# Patient Record
Sex: Female | Born: 1945 | Race: White | Hispanic: No | Marital: Married | Smoking: Former smoker
Health system: Southern US, Community
[De-identification: ages and names within clinical notes are randomized; demographics above are authoritative.]

## PROBLEM LIST (undated history)

## (undated) DIAGNOSIS — B1910 Unspecified viral hepatitis B without hepatic coma: Secondary | ICD-10-CM

## (undated) DIAGNOSIS — R351 Nocturia: Secondary | ICD-10-CM

## (undated) DIAGNOSIS — I493 Ventricular premature depolarization: Secondary | ICD-10-CM

## (undated) DIAGNOSIS — M8580 Other specified disorders of bone density and structure, unspecified site: Secondary | ICD-10-CM

## (undated) DIAGNOSIS — Z9079 Acquired absence of other genital organ(s): Secondary | ICD-10-CM

## (undated) DIAGNOSIS — Z90722 Acquired absence of ovaries, bilateral: Secondary | ICD-10-CM

## (undated) HISTORY — DX: Unspecified viral hepatitis B without hepatic coma: B19.10

## (undated) HISTORY — DX: Nocturia: R35.1

## (undated) HISTORY — DX: Ventricular premature depolarization: I49.3

## (undated) HISTORY — DX: Acquired absence of ovaries, bilateral: Z90.722

## (undated) HISTORY — PX: HERNIA REPAIR: SHX51

## (undated) HISTORY — DX: Other specified disorders of bone density and structure, unspecified site: M85.80

## (undated) HISTORY — DX: Acquired absence of other genital organ(s): Z90.79

---

## 1998-05-19 HISTORY — PX: CHOLECYSTECTOMY: SHX55

## 1999-03-06 ENCOUNTER — Other Ambulatory Visit: Admission: RE | Admit: 1999-03-06 | Discharge: 1999-03-06 | Payer: Self-pay | Admitting: Obstetrics and Gynecology

## 2000-06-04 ENCOUNTER — Other Ambulatory Visit: Admission: RE | Admit: 2000-06-04 | Discharge: 2000-06-04 | Payer: Self-pay | Admitting: Obstetrics and Gynecology

## 2000-06-26 ENCOUNTER — Encounter (INDEPENDENT_AMBULATORY_CARE_PROVIDER_SITE_OTHER): Payer: Self-pay | Admitting: Specialist

## 2000-06-26 ENCOUNTER — Other Ambulatory Visit: Admission: RE | Admit: 2000-06-26 | Discharge: 2000-06-26 | Payer: Self-pay | Admitting: Obstetrics and Gynecology

## 2000-12-10 ENCOUNTER — Encounter: Admission: RE | Admit: 2000-12-10 | Discharge: 2000-12-10 | Payer: Self-pay | Admitting: Family Medicine

## 2000-12-10 ENCOUNTER — Encounter: Payer: Self-pay | Admitting: Family Medicine

## 2001-01-08 ENCOUNTER — Encounter: Payer: Self-pay | Admitting: Gastroenterology

## 2001-01-08 ENCOUNTER — Encounter: Admission: RE | Admit: 2001-01-08 | Discharge: 2001-01-08 | Payer: Self-pay | Admitting: Gastroenterology

## 2001-09-07 ENCOUNTER — Other Ambulatory Visit: Admission: RE | Admit: 2001-09-07 | Discharge: 2001-09-07 | Payer: Self-pay | Admitting: Obstetrics and Gynecology

## 2001-10-25 ENCOUNTER — Encounter (INDEPENDENT_AMBULATORY_CARE_PROVIDER_SITE_OTHER): Payer: Self-pay | Admitting: *Deleted

## 2001-10-25 ENCOUNTER — Ambulatory Visit (HOSPITAL_COMMUNITY): Admission: RE | Admit: 2001-10-25 | Discharge: 2001-10-26 | Payer: Self-pay | Admitting: *Deleted

## 2002-05-04 ENCOUNTER — Encounter: Admission: RE | Admit: 2002-05-04 | Discharge: 2002-08-02 | Payer: Self-pay | Admitting: Occupational Medicine

## 2002-05-19 HISTORY — PX: OOPHORECTOMY: SHX86

## 2002-10-05 ENCOUNTER — Other Ambulatory Visit: Admission: RE | Admit: 2002-10-05 | Discharge: 2002-10-05 | Payer: Self-pay | Admitting: Obstetrics and Gynecology

## 2002-10-21 ENCOUNTER — Ambulatory Visit (HOSPITAL_COMMUNITY): Admission: RE | Admit: 2002-10-21 | Discharge: 2002-10-21 | Payer: Self-pay | Admitting: Gastroenterology

## 2003-01-11 ENCOUNTER — Encounter: Admission: RE | Admit: 2003-01-11 | Discharge: 2003-01-11 | Payer: Self-pay

## 2003-02-01 ENCOUNTER — Ambulatory Visit: Admission: RE | Admit: 2003-02-01 | Discharge: 2003-02-01 | Payer: Self-pay | Admitting: Gynecology

## 2003-02-14 ENCOUNTER — Encounter: Admission: RE | Admit: 2003-02-14 | Discharge: 2003-02-14 | Payer: Self-pay | Admitting: Obstetrics and Gynecology

## 2003-02-14 ENCOUNTER — Encounter: Payer: Self-pay | Admitting: Obstetrics and Gynecology

## 2003-03-14 ENCOUNTER — Encounter (INDEPENDENT_AMBULATORY_CARE_PROVIDER_SITE_OTHER): Payer: Self-pay

## 2003-03-14 ENCOUNTER — Ambulatory Visit (HOSPITAL_COMMUNITY): Admission: RE | Admit: 2003-03-14 | Discharge: 2003-03-14 | Payer: Self-pay | Admitting: Obstetrics and Gynecology

## 2004-01-03 ENCOUNTER — Other Ambulatory Visit: Admission: RE | Admit: 2004-01-03 | Discharge: 2004-01-03 | Payer: Self-pay | Admitting: Obstetrics and Gynecology

## 2004-06-24 ENCOUNTER — Emergency Department (HOSPITAL_COMMUNITY): Admission: EM | Admit: 2004-06-24 | Discharge: 2004-06-24 | Payer: Self-pay | Admitting: Emergency Medicine

## 2005-05-26 ENCOUNTER — Other Ambulatory Visit: Admission: RE | Admit: 2005-05-26 | Discharge: 2005-05-26 | Payer: Self-pay | Admitting: Obstetrics and Gynecology

## 2009-07-17 ENCOUNTER — Emergency Department (HOSPITAL_BASED_OUTPATIENT_CLINIC_OR_DEPARTMENT_OTHER): Admission: EM | Admit: 2009-07-17 | Discharge: 2009-07-17 | Payer: Self-pay | Admitting: Emergency Medicine

## 2009-07-17 ENCOUNTER — Ambulatory Visit: Payer: Self-pay | Admitting: Interventional Radiology

## 2010-10-04 NOTE — H&P (Signed)
NAME:  Meghan Rodgers, Meghan Rodgers                          ACCOUNT NO.:  [id number]   MEDICAL RECORD NO.:  [id number]                   PATIENT TYPE:  AMB   LOCATION:  SDC                                  FACILITY:  [facility]   PHYSICIAN:  Juluis Mire, M.D.                DATE OF BIRTH:  [birth year]   DATE OF ADMISSION:  03/14/2003  DATE OF DISCHARGE:                                HISTORY & PHYSICAL   HISTORY OF PRESENT ILLNESS:  The patient is a 65 year old gravida 2, para 2  postmenopausal white female who presents for laparoscopic bilateral salpingo-  oophorectomy.   In relation to the present admission she had been evaluated by Areatha Keas,  M.D. complaining of swelling.  Because of concern about a possible  neoplastic process she had a CT scan performed on August 20.  This revealed  a 2 cm right ovarian cyst.  Subsequent ultrasound revealed a 2.2 cm complex  cyst of the right ovary.  Her CA-125 was 4.2.  She had a follow-up  ultrasound with persistent cystic enlargement of the ovary.  She underwent  consultation with De Blanch, M.D. of the [facility]  [facility].  It was felt that if this persisted surgical removal would  certainly be an option.  She now presents for laparoscopic bilateral  salpingo-oophorectomy per her request, although the risk of ovarian cancer  appears to be relatively low.   ALLERGIES:  MYCINS.   MEDICATIONS:  None.   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   PAST SURGICAL HISTORY:  She has had bilateral hernia repairs as a child and  did have a laparoscopic cholecystectomy.   PAST OBSTETRICAL HISTORY:  Two spontaneous vaginal deliveries.   FAMILY HISTORY:  Basically noncontributory.   SOCIAL HISTORY:  No tobacco or alcohol use.   REVIEW OF SYSTEMS:  Noncontributory.   PHYSICAL EXAMINATION:  VITAL SIGNS:  The patient is afebrile with stable  vital signs.  HEENT:  The patient is normocephalic.   Pupils are equal, round, and reactive  to light and accommodation.  Extraocular movements are intact.  Sclerae and  conjunctivae clear.  Oropharynx clear.  NECK:  Without thyromegaly.  BREASTS:  No discreet masses.  LUNGS:  Clear.  CARDIOVASCULAR:  Regular rhythm and rate without murmurs or gallops.  ABDOMEN:  Benign.  No masses, organomegaly, or tenderness.  PELVIC:  Normal external genitalia.  Vaginal mucosa is clear.  Cervix  unremarkable.  Uterus normal size, shape, and contour.  Adnexa:  No masses  are appreciated.  Rectovaginal examination is clear.  EXTREMITIES:  Trace edema.  NEUROLOGIC:  Grossly within normal limits.   IMPRESSION:  Persistent cystic enlargement of the right ovary.   PLAN:  The patient will undergo laparoscopic bilateral salpingo-  oophorectomy.  Preoperative stents will be placed.  Risks of surgery have  been explained including the risk of  infection, the risk of hemorrhage, the  risk of injury to adjacent organs that could require further exploratory  surgery, risk of deep venous thrombosis and pulmonary embolus.  The patient  expressed understanding of indications and risks.                                               Juluis Mire, M.D.    JSM/MEDQ  D:  03/14/2003  T:  03/14/2003  Job:  [id number]

## 2010-10-04 NOTE — Op Note (Signed)
   NAME:  Meghan Rodgers, Meghan Rodgers                          ACCOUNT NO.:  [id number]   MEDICAL RECORD NO.:  [id number]                   PATIENT TYPE:  AMB   LOCATION:  SDC                                  [facility]   PHYSICIAN:  Meghan Rodgers, M.D.               DATE OF BIRTH:  March 26, 1946   DATE OF PROCEDURE:  03/14/2003  DATE OF DISCHARGE:                                 OPERATIVE REPORT   PROCEDURE:  Cystoscopy and insertion of bilateral double J ureteral  catheters.   SURGEON:  Meghan Rodgers, M.D.   ANESTHESIA:  General.   INDICATIONS:  The patient is a 65 year old female who is scheduled for total  abdominal hysterectomy and at Dr. Lisbeth Ply request, we inserted bilateral  ureteral catheters.   Under general anesthesia, the patient was prepped and draped and placed in  the dorsal lithotomy position.  A #23 Wappler cystoscope was inserted in the  bladder.  The bladder mucosa is normal.  There is no stone or tumor in the  bladder.  The ureteral orifices are in normal position and shape with clear  efflux.  Then a #6 [place] open-end catheter was passed over a Glidewire and  the ureteral catheter was passed through the cystoscope into the left  ureteral orifice and the open-end catheter was advanced over the guidewire  into the renal pelvis.  The same procedure was done on the right side.   Then a #16 Foley catheter was inserted in the bladder.  The patient was left  in dorsal lithotomy position for the procedure by Dr. Arelia Sneddon.                                               Meghan Rodgers, M.D.    MN/MEDQ  D:  03/14/2003  T:  03/14/2003  Job:  [id number]   cc:   Juluis Mire, M.D.  [address] [place]  [place] [address]  Fax: [phone]

## 2010-10-04 NOTE — Consult Note (Signed)
NAME:  Meghan Rodgers, Meghan Rodgers NO.:  [id number]   MEDICAL RECORD NO.:  [id number]                   PATIENT TYPE:  OUT   LOCATION:  GYN                                  FACILITY:  [facility]   PHYSICIAN:  De Blanch, M.D.         DATE OF BIRTH:  1945/12/14   DATE OF CONSULTATION:  02/01/2003  DATE OF DISCHARGE:                                   CONSULTATION   REASON FOR CONSULTATION:  A 65 year old woman married female seen at the  request of Dr. Arelia Sneddon regarding management of a newly-diagnosed ovarian  cyst.   HISTORY OF PRESENT ILLNESS:  The patient apparently presented complaining of  swelling in her hands and supraclavicular region.  Because of the  possibility of these symptoms representing a neoplastic syndrome, the  patient underwent a CT scan which showed a 2.2 cm ovarian cyst.  Subsequently, she has had an ultrasound on January 11, 2003 which shows a 2.2  cm cyst with internal echoes and an irregular posterior wall.  CA-125 value  was 4.2.   The patient's gynecologic history is relatively unremarkable, except for the  fact that she went through menopause relatively early at age 63.  She took  hormone replacement therapy for approximately six years, but has not taken  any for the past six years.  She has no family history of ovarian, breast,  or colon cancer.   OBSTETRICAL HISTORY:  Gravida 2.   PAST MEDICAL HISTORY:  Medical illnesses - none.   PAST SURGICAL HISTORY:  A cholecystectomy, left inguinal hernia repair as a  child, right inguinal hernia repair as a young adult.   DRUG ALLERGIES:  1. NAPROXEN.  2. ILOSONE.   REVIEW OF SYSTEMS:  Essentially negative, except as noted above.  The  patient's main concern is that her face is widened.  She has fullness in the  supraclavicular fossa and axillary region, as well as swelling in her  fingers.  She has no cardiovascular, pulmonary, GI, or GU symptoms.  She did  have a colonoscopy by  Dr. Matthias Hughs recently.   PHYSICAL EXAMINATION:  VITAL SIGNS:  Weight 188 pounds, height 5'6, blood  pressure 124/78, pulse 84, respiratory rate 24.  GENERAL:  The patient is a healthy white female in no acute distress.  HEENT:  Negative.  There is some fullness in the supraclavicular fossa  without any masses, nodularity, or adenopathy.  NECK:  Supple without thyromegaly.  There is no supraclavicular or inguinal  adenopathy.  ABDOMEN:  Obese, soft, nontender.  No mass, organomegaly, or hernias noted.  Her laparoscopic cholecystectomy scars are well healed.  PELVIC:  EGBUS, vagina, bladder, and urethra are normal.  Cervix is normal.  Uterus is small and anterior.  I do not palpate any adnexal masses.  RECTOVAGINAL:  Confirms.  EXTREMITIES:  Lower extremities are without edema or varicosities.   IMPRESSION:  A 2.2 cm ovarian cyst with internal echoes  and irregular wall.  I would recommend the patient have a repeat ultrasound in 4-6 weeks  following the previous one.  If the cyst is resolved, then I would dispense  with any further follow up.  On the other hand, it if persists or has gotten  larger, I would suggest it be removed laparoscopically, and obtain  intraoperative projections.  If the patient were to require surgery, I would  be happy to be on stand by with these circumstances, which I think are  actually quite low risk for ovarian cancer.                                               De Blanch, M.D.    DC/MEDQ  D:  02/01/2003  T:  02/01/2003  Job:  [id number]   cc:   Juluis Mire, M.D.  [address] [place]  [place]  [place] [address]  Fax: [phone]   Areatha Keas, M.D.  [address]  [address]  [place]  [place] [address]  Fax: [phone]

## 2010-10-04 NOTE — Op Note (Signed)
   NAME:  Meghan Rodgers, Meghan Rodgers                          ACCOUNT NO.:  [id number]   MEDICAL RECORD NO.:  [id number]                   PATIENT TYPE:  AMB   LOCATION:  ENDO                                 FACILITY:  [facility]   PHYSICIAN:  Bernette Redbird, M.D.                DATE OF BIRTH:  07-Jun-1945   DATE OF PROCEDURE:  10/21/2002  DATE OF DISCHARGE:                                 OPERATIVE REPORT   PROCEDURE PERFORMED:  Colonoscopy.   ENDOSCOPIST:  Bernette Redbird, M.D.   INDICATIONS:  [age]-year-old female for screening.  No worrisome risk  factors or symptoms.  There is a tendency towards diarrhea after fatty meals  since her cholecystectomy about a year ago.   FINDINGS:  Normal exam to the terminal ileum.   PROCEDURE:  The nature, purpose and risks of the procedure had previously  been discussed with the patient who provided written consent.   SEDATION:  Fentanyl 70 mcg and Versed 7 mg IV without arrhythmias or  desaturation.   DESCRIPTION OF PROCEDURE:  The Olympus adjustable tension pediatric video  colonoscope was advanced into the terminal ileum without difficulty,  negotiating some tight angulations in the sigmoid region.  Pullback was then  performed.  Photo documentation of the cecum was obtained.   The quality of the prep was excellent and it is felt that all areas were  well-seen.   This was a normal examination.  No polyps, cancer, colitis, vascular  malformations, or diverticular disease were observed.  Retroflexion in the  rectum as well as reinspection of the rectosigmoid was unremarkable.  No  biopsies were obtained.   The patient tolerated the procedure well and there were no apparent  complications.    IMPRESSION:  Normal screening colonoscopy.   PLAN:  Consider screening flex-sig in five years for ongoing screening.                                                 Bernette Redbird, M.D.    RB/MEDQ  D:  10/21/2002  T:  10/23/2002  Job:  [id number]   cc:    Juluis Mire, M.D.  [address] [place]  [place] [address]  Fax: [phone]   Teena Irani. Arlyce Dice, M.D.  [address]  [place]  [place] [address]  Fax: [phone]

## 2010-10-04 NOTE — Op Note (Signed)
NAME:  Meghan Rodgers, Meghan Rodgers                          ACCOUNT NO.:  [id number]   MEDICAL RECORD NO.:  [id number]                   PATIENT TYPE:  AMB   LOCATION:  SDC                                  [facility]   PHYSICIAN:  Juluis Mire, M.D.                DATE OF BIRTH:  09-02-1945   DATE OF PROCEDURE:  03/14/2003  DATE OF DISCHARGE:                                 OPERATIVE REPORT   PREOPERATIVE DIAGNOSIS:  Cystic enlargement of the right ovary.   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   PROCEDURE:  Open laparoscopy with bilateral salpingo-oophorectomy, lysis of  adhesions, repair of umbilical hernia.   SURGEON:  Juluis Mire, M.D.   ANESTHESIA:  General endotracheal anesthesia.   ESTIMATED BLOOD LOSS:  Minimal.   PACKS AND DRAINS:  None.   INTRAOPERATIVE BLOOD PLACED:  None.   COMPLICATIONS:  None.   INDICATIONS FOR PROCEDURE:  These are dictated in the history and physical.   DESCRIPTION OF PROCEDURE:  The patient taken to the OR and placed in the  supine position.  After satisfactory level of general endotracheal  anesthesia was obtained, the patient was placed in the dorsal lithotomy  position.  Dr. Brunilda Payor came in at this time and placed in bilateral ureteral  stents.  Foley was emptied at that point.  We have completely prepped the  abdomen, perineum and vagina.  Hulka tenaculum was then put in place and  secured.  The patient was draped out for laparoscopy.  Subumbilical incision  was made with the knife and carried through the subcutaneous tissue.  Noted  at this time that omentum was extruding through a fascial defect from a  previous cholecystectomy.  We, therefore, placed the open laparoscopic  trocar and secured it.  The laparoscope was introduced.  There was omentum  noted around the umbilicus but the inferior aspect was clear so we could  easily see the pelvis.  The 5 mm trocars were placed in suprapubic area  under direct visualization.  A  third trocar was placed in the left lower  quadrant after visualization of epigastric vessels.  Uterus was elevated.  The right ovary was cystically enlarged.  There were no excrescences.  We  did obtain pelvic washings that were sent for cytology.  She had some  adhesions between the sigmoid colon and the left pelvic sidewall.  These  were flimsy and taken down easily.  We first elevated the right ovary,  identified the right ureter but with aid of the stent using the Gyrus  tripolar, we cauterized and incised the right ovarian vasculature.  We  cauterized and incised the peritoneal attachment of the ovary and we  cauterized and incised the utero-ovarian pedicle and right tube and  mesosalpinx.  The ovary was then placed in the Endo bag and removed through  the subumbilical port.  It was sent for frozen section  which came back with  a benign adenofibroma.  We had good hemostasis.  Next, we went to the left  side.  The left ovary was elevated.  The left ureter was identified easily  with the stent.  The left ovarian vasculature was cauterized, incised using  the Gyrus bipolar.  The attachment of the mesosalpinx was then cauterized  and incised, the left utero-ovarian pedicle, tube and mesosalpinx were  cauterized and incised.  The ovary was removed through the subumbilical  port.  We had good hemostasis bilaterally.  All adhesions were taken down.  At this point in time, we thoroughly irrigated the pelvis. Irrigation  removed.  The appendix was visualized and noted to be normal.  We removed  all trocars.  We removed the omentum from inside the umbilicus.  We  identified the fascial edge and brought those together with a running suture  of 0 PDS.  This completely reapproximated the fascial edges.  The skin was  closed with running subcuticular 4-0 Vicryl. The suprapubic incision was  closed with Dermabond.  The Hulka tenaculum was then removed.  The patient  was taken out of the dorsal  lithotomy position, once alert and extubated,  transferred to the recovery room in good condition.  It is of note, the  sponge, needle and instrument counts were correct by circulating nurse x2.  The ureteral stents were removed and packed.                                               Juluis Mire, M.D.    JSM/MEDQ  D:  03/14/2003  T:  03/14/2003  Job:  [id number]

## 2010-10-04 NOTE — Op Note (Signed)
[facility]  Patient:    Meghan Rodgers, Meghan Rodgers Visit Number: [id number] MRN: [id number]          Service Type: DSU Location: [id number] Attending Physician:  Vikki Ports. Dictated by:   Earna Coder, M.D. Proc. Date: 10/24/01 Admit Date:  10/25/2001 Discharge Date: 10/26/2001                             Operative Report  PREOPERATIVE DIAGNOSIS:  Cholelithiasis.  POSTOPERATIVE DIAGNOSIS:  Cholelithiasis.  OPERATION PERFORMED:  Laparoscopic cholecystectomy.  SURGEON:  Stephenie Acres, M.D.  ASSISTANT:  Currie Paris, M.D.  ANESTHESIA:  General.  DESCRIPTION OF PROCEDURE:  The patient was taken to the operating room and placed in supine position.  After adequate anesthesia was induced using endotracheal tube, the abdomen was prepped and draped in normal sterile fashion.  Using a transverse infraumbilical incision, I dissected down to the fascia.  The fascia was opened vertically.  A 0 Vicryl pursestring suture was placed around the fascial defect.  Hasson trocar was placed in the abdomen and the abdomen was insufflated with carbon dioxide.  Under direct visualization, a 10 mm port was placed in the subxiphoid region and two 5 mm ports were placed in the right abdomen.  The gallbladder was identified and retracted cephalad.  The infundibulum was identified.  The cystic duct was easily dissected free.  A good window was created behind it.  Its junction with the gallbladder was identified.  The cystic duct was then triply and divided.  The cystic artery had both anterior and posterior branches.  These were dissected free of surrounding structures, triply clipped and divided.  The gallbladder was taken off the gallbladder bed using bipolar electrocautery and removed through the umbilical port.  Adequate hemostasis was ensured.  The abdomen was allowed to deflate.  All trocars were removed.  Incisions were injected  using 0.5% Marcaine.  Fascial defect was closed with the 0 Vicryl pursestring suture.  Skin incisions were closed subcuticular 4-0 Monocryl.  Steri-Strips and sterile dressings were applied.  The patient tolerated the procedure well and went to PACU in good condition. Dictated by:   Earna Coder, M.D. Attending Physician:  Danna Hefty R. DD:  10/25/01 TD:  10/27/01 Job: [id number]

## 2011-09-23 ENCOUNTER — Encounter: Payer: Self-pay | Admitting: *Deleted

## 2011-09-24 ENCOUNTER — Ambulatory Visit (INDEPENDENT_AMBULATORY_CARE_PROVIDER_SITE_OTHER): Payer: 59 | Admitting: Cardiovascular Disease

## 2011-09-24 ENCOUNTER — Encounter: Payer: Self-pay | Admitting: Cardiovascular Disease

## 2011-09-24 VITALS — BP 152/87 | HR 61 | Ht 66.5 in | Wt 196.0 lb

## 2011-09-24 DIAGNOSIS — I4949 Other premature depolarization: Secondary | ICD-10-CM

## 2011-09-24 DIAGNOSIS — I493 Ventricular premature depolarization: Secondary | ICD-10-CM | POA: Insufficient documentation

## 2011-09-24 DIAGNOSIS — I10 Essential (primary) hypertension: Secondary | ICD-10-CM | POA: Insufficient documentation

## 2011-09-24 LAB — BASIC METABOLIC PANEL
Calcium: 9.3 mg/dL (ref 8.4–10.5)
Creatinine, Ser: 0.9 mg/dL (ref 0.4–1.2)
GFR: 70.69 mL/min (ref >60.00)
Sodium: 141 mEq/L (ref 135–145)

## 2011-09-24 NOTE — Assessment & Plan Note (Addendum)
Meghan Rodgers presents for further evaluation of her palpitations. She has heartbeat irregular to set are consistent with premature ventricular contractions. We saw one PVC on her EKG.  We discussed the various causes of PVCs including hyperlipidemia, thyroid abnormalities , excessive caffeine,.  We will check a TSH and a basic metabolic profile. I've asked her to walk on a regular basis. I think that her right her exercise this will help slow her heart rate down.  He does have some dyspnea on exertion. I would like to get an echocardiogram for further evaluation to make sure that she does not have any structural heart disease. I'll see her again in 3 months.

## 2011-09-24 NOTE — Patient Instructions (Signed)
Your physician recommends that you return for lab work in: TODAY, TSH, BMET  Your physician has requested that you have an echocardiogram. Echocardiography is a painless test that uses sound waves to create images of your heart. It provides your doctor with information about the size and shape of your heart and how well your heart's chambers and valves are working. This procedure takes approximately one hour. There are no restrictions for this procedure.  Your physician recommends that you schedule a follow-up appointment in: 3 MONTHS     DASH Diet The DASH diet stands for "Dietary Approaches to Stop Hypertension." It is a healthy eating plan that has been shown to reduce high blood pressure (hypertension) in as little as 14 days, while also possibly providing other significant health benefits. These other health benefits include reducing the risk of breast cancer after menopause and reducing the risk of type 2 diabetes, heart disease, colon cancer, and stroke. Health benefits also include weight loss and slowing kidney failure in patients with chronic kidney disease.  DIET GUIDELINES  Limit salt (sodium). Your diet should contain less than 1500 mg of sodium daily.   Limit refined or processed carbohydrates. Your diet should include mostly whole grains. Desserts and added sugars should be used sparingly.   Include small amounts of heart-healthy fats. These types of fats include nuts, oils, and tub margarine. Limit saturated and trans fats. These fats have been shown to be harmful in the body.  CHOOSING FOODS  The following food groups are based on a 2000 calorie diet. See your Registered Dietitian for individual calorie needs. Grains and Grain Products (6 to 8 servings daily)  Eat More Often: Whole-wheat bread, brown rice, whole-grain or wheat pasta, quinoa, popcorn without added fat or salt (air popped).   Eat Less Often: White bread, white pasta, white rice, cornbread.  Vegetables (4 to 5  servings daily)  Eat More Often: Fresh, frozen, and canned vegetables. Vegetables may be raw, steamed, roasted, or grilled with a minimal amount of fat.   Eat Less Often/Avoid: Creamed or fried vegetables. Vegetables in a cheese sauce.  Fruit (4 to 5 servings daily)  Eat More Often: All fresh, canned (in natural juice), or frozen fruits. Dried fruits without added sugar. One hundred percent fruit juice ( cup [237 mL] daily).   Eat Less Often: Dried fruits with added sugar. Canned fruit in light or heavy syrup.  [employer], Fish, and Poultry (2 servings or less daily. One serving is 3 to 4 oz [85-114 g]).  Eat More Often: Ninety percent or leaner ground beef, tenderloin, sirloin. Round cuts of beef, chicken breast, [place] breast. All fish. Grill, bake, or broil your meat. Nothing should be fried.   Eat Less Often/Avoid: Fatty cuts of meat, [place], or chicken leg, thigh, or wing. Fried cuts of meat or fish.  Dairy (2 to 3 servings)  Eat More Often: Low-fat or fat-free milk, low-fat plain or light yogurt, reduced-fat or part-skim cheese.   Eat Less Often/Avoid: Milk (whole, 2%, skim, or chocolate).Whole milk yogurt. Full-fat cheeses.  Nuts, Seeds, and Legumes (4 to 5 servings per week)  Eat More Often: All without added salt.   Eat Less Often/Avoid: Salted nuts and seeds, canned beans with added salt.  Fats and Sweets (limited)  Eat More Often: Vegetable oils, tub margarines without trans fats, sugar-free gelatin. Mayonnaise and salad dressings.   Eat Less Often/Avoid: Coconut oils, palm oils, butter, stick margarine, cream, half and half, cookies, candy, pie.  FOR MORE INFORMATION The Dash Diet Eating Plan: www.dashdiet.org Document Released: 04/24/2011 Document Reviewed: 04/14/2011 [facility] Patient Information 2012 [place], [place].

## 2011-09-24 NOTE — Progress Notes (Signed)
    Meghan Rodgers Date of Birth  12/27/1945       [facility] Office [address], [address]  [address] [place], [place]  [address]   [place], [place]  [address] [phone]     [phone]   Fax  [phone]    Fax [phone]  Problem List: 1. Palpitations / PVCs 2. Hypertension   History of Present Illness:  Meghan Rodgers is a 66 y.o. female with hx of palpitations and hypertension.  She has these occasional palpitations which according her are about every 8 beats. They do not cause her to have any syncope, shortness breath, or chest pain. She has some tinnitus and therefore is able to hear these irregular heartbeats.  She does not get any regular aerobic exercise. She is to go to yoga classes but has not done so in 2 years.  She eats a fairly unrestricted diet.  No current outpatient prescriptions on file prior to visit.    Allergies  Allergen Reactions  . Ilosone (Erythromycin)   . Naproxen     Past Medical History  Diagnosis Date  . PVC (premature ventricular contraction)     The Patient has atrial tachycardia in the past  . Nocturia   . H/O bilateral salpingo-oophorectomy     Previous laparoscopic, for adenofibromas  . Osteopenia     stable or improving  . Hepatitis B     Positive for core antigen    Past Surgical History  Procedure Date  . Cholecystectomy 2000  . Hernia repair 1950, 1979  . Oophorectomy 2004    History  Smoking status  . Former Smoker  Smokeless tobacco  . Not on file  Comment: Quit 6yrs ago    History  Alcohol Use  . Yes    Occas.    History reviewed. No pertinent family history.  Reviw of Systems:  Reviewed in the HPI.  All other systems are negative.  Physical Exam: Blood pressure 152/87, pulse 61, height 5' 6.5" (1.689 m), weight 196 lb (88.905 kg). General: Well developed, well nourished, in no acute distress.  Head: Normocephalic, atraumatic, sclera non-icteric, mucus  membranes are moist,   Neck: Supple. Carotids are 2 + without bruits. No JVD  Lungs: Clear bilaterally to auscultation.  Heart: regular rate.  normal  S1 S2. No murmurs, gallops or rubs.  Abdomen: Soft, non-tender, non-distended with normal bowel sounds. No hepatomegaly. No rebound/guarding. No masses.  Msk:  Strength and tone are normal  Extremities: No clubbing or cyanosis. No edema.  Distal pedal pulses are 2+ and equal bilaterally.  Neuro: Alert and oriented X 3. Moves all extremities spontaneously.  Psych:  Responds to questions appropriately with a normal affect.  ECG: Sep 24, 2011-normal sinus rhythm at 61 beats a minute. She has sinus arrhythmia. She has occasional premature ventricular contractions.  Assessment / Plan:

## 2011-09-24 NOTE — Assessment & Plan Note (Signed)
She eats a fairly unrestricted diet. I've asked her to watch her salt intake. We've given her some instructions on the DASH diet.

## 2011-09-26 ENCOUNTER — Ambulatory Visit (HOSPITAL_COMMUNITY): Payer: 59

## 2011-09-30 ENCOUNTER — Other Ambulatory Visit (HOSPITAL_COMMUNITY): Payer: 59

## 2011-09-30 ENCOUNTER — Other Ambulatory Visit: Payer: Self-pay

## 2011-09-30 ENCOUNTER — Other Ambulatory Visit (HOSPITAL_COMMUNITY): Payer: Self-pay | Admitting: Cardiovascular Disease

## 2011-09-30 ENCOUNTER — Ambulatory Visit (HOSPITAL_COMMUNITY): Payer: 59 | Attending: Cardiology

## 2011-09-30 DIAGNOSIS — I10 Essential (primary) hypertension: Secondary | ICD-10-CM | POA: Insufficient documentation

## 2011-09-30 DIAGNOSIS — R002 Palpitations: Secondary | ICD-10-CM

## 2011-09-30 DIAGNOSIS — B1910 Unspecified viral hepatitis B without hepatic coma: Secondary | ICD-10-CM | POA: Insufficient documentation

## 2011-10-06 ENCOUNTER — Ambulatory Visit: Payer: 59 | Attending: Family Medicine | Admitting: Audiology

## 2011-10-06 DIAGNOSIS — H905 Unspecified sensorineural hearing loss: Secondary | ICD-10-CM | POA: Insufficient documentation

## 2011-11-05 ENCOUNTER — Encounter: Payer: Self-pay | Admitting: Cardiovascular Disease

## 2012-01-02 ENCOUNTER — Encounter: Payer: Self-pay | Admitting: Cardiovascular Disease

## 2012-01-02 ENCOUNTER — Ambulatory Visit (INDEPENDENT_AMBULATORY_CARE_PROVIDER_SITE_OTHER): Payer: 59 | Admitting: Cardiovascular Disease

## 2012-01-02 VITALS — BP 142/78 | HR 71 | Ht 66.5 in | Wt 197.8 lb

## 2012-01-02 DIAGNOSIS — R768 Other specified abnormal immunological findings in serum: Secondary | ICD-10-CM | POA: Insufficient documentation

## 2012-01-02 DIAGNOSIS — R894 Abnormal immunological findings in specimens from other organs, systems and tissues: Secondary | ICD-10-CM

## 2012-01-02 DIAGNOSIS — I4949 Other premature depolarization: Secondary | ICD-10-CM

## 2012-01-02 DIAGNOSIS — I493 Ventricular premature depolarization: Secondary | ICD-10-CM

## 2012-01-02 NOTE — Assessment & Plan Note (Signed)
Pt is doing very well. She still has occasional palpitations. She has normal left ventricular systolic function. I'll see her back on an as-needed basis.

## 2012-01-02 NOTE — Patient Instructions (Addendum)
Your physician recommends that you schedule a follow-up appointment in: AS NEEDED BASIS  

## 2012-01-02 NOTE — Progress Notes (Signed)
    Meghan Rodgers Date of Birth  06-Aug-1945       [facility] Office [address]  [address] [place], [place]  [address]   [place], [place]  [address] [phone]     [phone]   Fax  [phone]    Fax [phone]  Problem List: 1. Palpitations / PVCs 2. Hypertension   History of Present Illness:  Meghan Rodgers is a 66 y.o. female with hx of palpitations and hypertension.  She has these occasional palpitations which according her are about every 8 beats. They do not cause her to have any syncope, shortness breath, or chest pain. She has some tinnitus and therefore is able to hear these irregular heartbeats.  She does not get any regular aerobic exercise. She is to go to yoga classes but has not done so in 2 years.  She eats a fairly unrestricted diet.  Her echo was normal.    As an aside, she was rejected by the [facility] for Hepatitis B antibodies.  No active disease   Current Outpatient Prescriptions on File Prior to Visit  Medication Sig Dispense Refill  . NON FORMULARY Max GXL Daily      . NON FORMULARY Protandum Daily        Allergies  Allergen Reactions  . Ilosone (Erythromycin)   . Naproxen     Past Medical History  Diagnosis Date  . PVC (premature ventricular contraction)     The Patient has atrial tachycardia in the past  . Nocturia   . H/O bilateral salpingo-oophorectomy     Previous laparoscopic, for adenofibromas  . Osteopenia     stable or improving  . Hepatitis B     Positive for core antigen    Past Surgical History  Procedure Date  . Cholecystectomy 2000  . Hernia repair 1950, 1979  . Oophorectomy 2004    History  Smoking status  . Former Smoker  Smokeless tobacco  . Not on file  Comment: Quit 36yrs ago    History  Alcohol Use  . Yes    Occas.    No family history on file.  Reviw of Systems:  Reviewed in the HPI.  All other systems are negative.  Physical Exam: Blood  pressure 142/78, pulse 71, height 5' 6.5" (1.689 m), weight 197 lb 12.8 oz (89.721 kg). General: Well developed, well nourished, in no acute distress.  Head: Normocephalic, atraumatic, sclera non-icteric, mucus membranes are moist,   Neck: Supple. Carotids are 2 + without bruits. No JVD  Lungs: Clear bilaterally to auscultation.  Heart: regular rate.  normal  S1 S2. No murmurs, gallops or rubs.  Abdomen: Soft, non-tender, non-distended with normal bowel sounds. No hepatomegaly. No rebound/guarding. No masses.  Msk:  Strength and tone are normal  Extremities: No clubbing or cyanosis. No edema.  Distal pedal pulses are 2+ and equal bilaterally.  Neuro: Alert and oriented X 3. Moves all extremities spontaneously.  Psych:  Responds to questions appropriately with a normal affect.  ECG: Sep 24, 2011-normal sinus rhythm at 61 beats a minute. She has sinus arrhythmia. She has occasional premature ventricular contractions.  Assessment / Plan:

## 2012-07-03 ENCOUNTER — Other Ambulatory Visit: Payer: Self-pay

## 2012-08-03 ENCOUNTER — Emergency Department (HOSPITAL_COMMUNITY)
Admission: EM | Admit: 2012-08-03 | Discharge: 2012-08-03 | Disposition: A | Discharge status: Home / Self Care | Payer: 59 | Source: Home / Self Care | Attending: Emergency Medicine | Admitting: Emergency Medicine

## 2012-08-03 ENCOUNTER — Encounter (HOSPITAL_COMMUNITY): Payer: Self-pay | Admitting: *Deleted

## 2012-08-03 DIAGNOSIS — J209 Acute bronchitis, unspecified: Secondary | ICD-10-CM

## 2012-08-03 DIAGNOSIS — J019 Acute sinusitis, unspecified: Secondary | ICD-10-CM

## 2012-08-03 LAB — POCT RAPID STREP A: Streptococcus, Group A Screen (Direct): NEGATIVE

## 2012-08-03 MED ORDER — AMOXICILLIN-POT CLAVULANATE 875-125 MG PO TABS: 1.0000 | ORAL_TABLET | Freq: Two times a day (BID) | ORAL | Status: DC

## 2012-08-03 MED ORDER — HYDROCOD POLST-CHLORPHEN POLST 10-8 MG/5ML PO LQCR: 5.0000 mL | Freq: Two times a day (BID) | ORAL | Status: DC | PRN

## 2012-08-03 NOTE — ED Notes (Signed)
Pt reports URI symptoms since Friday with sneezing, nasal drainage, tan phelgm, cough - little relief from otc meds

## 2012-08-03 NOTE — ED Provider Notes (Signed)
Chief Complaint:   Chief Complaint  Patient presents with  . URI    History of Present Illness:   Meghan Rodgers is a 67 year old female who has had a five-day history of sore throat, cough productive yellow sputum, nasal congestion with yellow drainage, and nausea. She denies any fever, chills, wheezing, chest pain, headache, sinus pressure, or ear pain. She's had no vomiting, abdominal pain, or diarrhea. She has not been exposed to anything in particular. She is using Vicks cough syrup with minimal relief.   Review of Systems:  Other than noted above, the patient denies any of the following symptoms: Systemic:  No fevers, chills, sweats, weight loss or gain, fatigue, or tiredness. Eye:  No redness or discharge. ENT:  No ear pain, drainage, headache, nasal congestion, drainage, sinus pressure, difficulty swallowing, or sore throat. Neck:  No neck pain or swollen glands. Lungs:  No cough, sputum production, hemoptysis, wheezing, chest tightness, shortness of breath or chest pain. GI:  No abdominal pain, nausea, vomiting or diarrhea.  PMFSH:  Past medical history, family history, social history, meds, and allergies were reviewed. She is allergic to naproxen and erythromycin was. She takes no medications. She has had no other medical problems.  Physical Exam:   Vital signs:  BP 138/85  Pulse 90  Temp(Src) 97.5 F (36.4 C) (Oral)  Resp 18  SpO2 95% General:  Alert and oriented.  In no distress.  Skin warm and dry. Eye:  No conjunctival injection or drainage. Lids were normal. ENT:  TMs and canals were normal, without erythema or inflammation.  Nasal mucosa was clear and uncongested, without drainage.  Mucous membranes were moist.  Pharynx was clear with no exudate or drainage.  There were no oral ulcerations or lesions. Neck:  Supple, no adenopathy, tenderness or mass. Lungs:  No respiratory distress.  Lungs were clear to auscultation, without wheezes, rales or rhonchi.  Breath sounds were  clear and equal bilaterally.  Heart:  Regular rhythm, without gallops, murmers or rubs. Skin:  Clear, warm, and dry, without rash or lesions.   Labs:   Results for orders placed during the hospital encounter of 08/03/12  POCT RAPID STREP A ([facility]RG CARE ONLY)      Result Value Range   Streptococcus, Group A Screen (Direct) NEGATIVE  NEGATIVE    Assessment:  The primary encounter diagnosis was Acute bronchitis. A diagnosis of Acute sinusitis was also pertinent to this visit.  She appears to have a viral upper respiratory infection. She is only been sick for 5 days, but she feels she needs antibiotic, so will go ahead and and prescribe an antibiotic for her along with Tussionex for the cough.  Plan:   1.  The following meds were prescribed:   New Prescriptions   AMOXICILLIN-CLAVULANATE (AUGMENTIN) 875-125 MG PER TABLET    Take 1 tablet by mouth 2 (two) times daily.   CHLORPHENIRAMINE-HYDROCODONE (TUSSIONEX) 10-8 MG/5ML LQCR    Take 5 mLs by mouth every 12 (twelve) hours as needed.   2.  The patient was instructed in symptomatic care and handouts were given. 3.  The patient was told to return if becoming worse in any way, if no better in 7 days, and given some red flag symptoms such as fever, difficulty breathing, chest pain, or vomiting that would indicate earlier return.      Reuben Likes, MD 08/03/12 [phone]

## 2012-08-15 ENCOUNTER — Telehealth (HOSPITAL_COMMUNITY): Payer: Self-pay | Admitting: Emergency Medicine

## 2012-08-15 NOTE — ED Notes (Signed)
Patient called stating that this morning she developed a rash over her entire body  Chart was reviewed by Dr Artis Flock.  He stated even though she has finished antibiotic that the reaction could still be from the Augmentin.  Patient advised not to take that medication again.  Augmentin added to patient allergy list

## 2012-11-03 ENCOUNTER — Other Ambulatory Visit: Payer: Self-pay | Admitting: Dermatology

## 2013-03-24 ENCOUNTER — Other Ambulatory Visit: Payer: Self-pay

## 2013-08-03 ENCOUNTER — Other Ambulatory Visit: Payer: Self-pay | Admitting: Gastroenterology

## 2014-10-09 ENCOUNTER — Ambulatory Visit (INDEPENDENT_AMBULATORY_CARE_PROVIDER_SITE_OTHER): Payer: Medicare Other | Admitting: Pulmonary Disease

## 2014-10-09 ENCOUNTER — Encounter: Payer: Self-pay | Admitting: *Deleted

## 2014-10-09 VITALS — BP 124/68 | HR 68 | Temp 97.6°F | Ht 66.0 in | Wt 196.2 lb

## 2014-10-09 DIAGNOSIS — G4733 Obstructive sleep apnea (adult) (pediatric): Secondary | ICD-10-CM | POA: Insufficient documentation

## 2014-10-09 NOTE — Patient Instructions (Signed)
Will schedule for home sleep testing, and will arrange followup once the results are available.   

## 2014-10-09 NOTE — Assessment & Plan Note (Signed)
The patient's history is very suspicious for clinically significant sleep disordered breathing. I have reviewed the pathophysiology of sleep apnea with her, including its impact to her quality of life and cardiovascular health. I think she will need to have a sleep study in order to establish a diagnosis, and she is an excellent candidate for home sleep testing. The patient is agreeable to this approach.

## 2014-10-09 NOTE — Progress Notes (Signed)
Subjective:    Patient ID: Meghan Rodgers, female    DOB: 01-19-1946, 69 y.o.   MRN: [id number]  HPI The patient is a 69 year old female who I've been asked to see for possible obstructive sleep apnea. She has been noted to have loud snoring as well as witnessed apneas during sleep.  She does not have frequent awakenings, and she typically will feel fairly rested in the mornings upon arising. She does note intermittent sleep pressure during the day with inactivity, and will also have issues in the evenings trying to read or watch television. She rarely has sleepiness with driving. The patient's that her weight is neutral over the last 2 years, and her Epworth score today is 3.   Sleep Questionnaire What time do you typically go to bed?( Between what hours) 10-11p 10-11p at 1454 on 10/09/14 by Tommie SamsMindy S Silva, CMA How long does it take you to fall asleep? 10 min 10 min at 1454 on 10/09/14 by Tommie SamsMindy S Silva, CMA How many times during the night do you wake up? 1 1 at 1454 on 10/09/14 by Tommie SamsMindy S Silva, CMA What time do you get out of bed to start your day? 0600 0600 at 1454 on 10/09/14 by Tommie SamsMindy S Silva, CMA Do you drive or operate heavy machinery in your occupation? No No at 1454 on 10/09/14 by Tommie SamsMindy S Silva, CMA How much has your weight changed (up or down) over the past two years? (In pounds) 0 oz (0 kg) 0 oz (0 kg) at 1454 on 10/09/14 by Tommie SamsMindy S Silva, CMA Have you ever had a sleep study before? No No at 1454 on 10/09/14 by Tommie SamsMindy S Silva, CMA Do you currently use CPAP? No No at 1454 on 10/09/14 by Tommie SamsMindy S Silva, CMA Do you wear oxygen at any time? No No at 1454 on 10/09/14 by Tommie SamsMindy S Silva, CMA   Review of Systems  Constitutional: Negative for fever and unexpected weight change.  HENT: Positive for congestion. Negative for dental problem, ear pain, nosebleeds, postnasal drip, rhinorrhea, sinus pressure, sneezing, sore throat and trouble swallowing.   Eyes: Negative for redness  and itching.  Respiratory: Positive for cough. Negative for chest tightness, shortness of breath and wheezing.   Cardiovascular: Negative for palpitations and leg swelling.  Gastrointestinal: Negative for nausea and vomiting.  Genitourinary: Negative for dysuria.  Musculoskeletal: Negative for joint swelling.  Skin: Negative for rash.  Neurological: Negative for headaches.  Hematological: Does not bruise/bleed easily.  Psychiatric/Behavioral: Negative for dysphoric mood. The patient is not nervous/anxious.        Objective:   Physical Exam Constitutional:  Overweight female, no acute distress  HENT:  Nares patent without discharge, septal deviation to the left with significant narrowing.   Oropharynx without exudate, palate and uvula are normal  Eyes:  Perrla, eomi, no scleral icterus  Neck:  No JVD, no TMG  Cardiovascular:  Normal rate, regular rhythm, no rubs or gallops.  No murmurs        Intact distal pulses  Pulmonary :  Normal breath sounds, no stridor or respiratory distress   No rales, rhonchi, or wheezing  Abdominal:  Soft, nondistended, bowel sounds present.  No tenderness noted.   Musculoskeletal:  mild lower extremity edema noted right >left  Lymph Nodes:  No cervical lymphadenopathy noted  Skin:  No cyanosis noted  Neurologic:  Alert, appropriate, moves all 4 extremities without obvious deficit.         Assessment & Plan:

## 2014-11-03 ENCOUNTER — Other Ambulatory Visit: Payer: Self-pay | Admitting: Pulmonary Disease

## 2014-11-03 DIAGNOSIS — G4733 Obstructive sleep apnea (adult) (pediatric): Secondary | ICD-10-CM

## 2014-11-13 ENCOUNTER — Other Ambulatory Visit: Payer: Self-pay

## 2014-11-15 DIAGNOSIS — G4730 Sleep apnea, unspecified: Secondary | ICD-10-CM | POA: Diagnosis not present

## 2014-11-15 DIAGNOSIS — G4733 Obstructive sleep apnea (adult) (pediatric): Secondary | ICD-10-CM | POA: Diagnosis not present

## 2014-11-17 ENCOUNTER — Telehealth: Payer: Self-pay | Admitting: Pulmonary Disease

## 2014-11-17 ENCOUNTER — Other Ambulatory Visit: Payer: Self-pay | Admitting: *Deleted

## 2014-11-17 DIAGNOSIS — G4733 Obstructive sleep apnea (adult) (pediatric): Secondary | ICD-10-CM | POA: Diagnosis not present

## 2014-11-17 DIAGNOSIS — G4730 Sleep apnea, unspecified: Secondary | ICD-10-CM | POA: Diagnosis not present

## 2014-11-17 NOTE — Telephone Encounter (Signed)
Patient notified. CPAP Titration ordered.  Nothing further needed.

## 2014-11-17 NOTE — Telephone Encounter (Signed)
Severe OSA - 61/hr CPAP Titration

## 2014-11-22 ENCOUNTER — Telehealth: Payer: Self-pay | Admitting: Pulmonary Disease

## 2014-11-22 NOTE — Telephone Encounter (Signed)
°  Dr. Vassie LollAlva placed order for patient to have a CPAP Titration Study. Called and spoke with pt's daughter to schedule and daughter stated that pt did not want to have this study done.  Please advise.  Thanks, Bjorn Loserhonda

## 2014-11-23 NOTE — Telephone Encounter (Signed)
Attempted to contact patient, could not leave message, mailbox full, wcb.

## 2014-11-23 NOTE — Telephone Encounter (Signed)
That is her choice Please let her know that she has severe OSA -stopped breathing 61/h Pl make appt with TP to discuss options

## 2014-11-24 NOTE — Telephone Encounter (Signed)
Attempted to contact pt. Mailbox is full. Will try back.

## 2014-11-27 NOTE — Telephone Encounter (Signed)
lmtcb

## 2014-11-27 NOTE — Telephone Encounter (Signed)
ATC PT mailbox full. WCB

## 2014-11-28 NOTE — Telephone Encounter (Signed)
Called and LVM and gave the Sleep Lab # [phone] for her to call and get on CXL list to be seen sooner.

## 2014-11-28 NOTE — Telephone Encounter (Signed)
Called spoke with pt. She is going to have the CPAP titration done in september and is wanting to know if something can be scheduled sooner. Please advise PCC's thanks

## 2014-11-29 NOTE — Telephone Encounter (Signed)
Mail box full, could not leave message for patient.

## 2014-11-30 ENCOUNTER — Encounter: Payer: Self-pay | Admitting: *Deleted

## 2014-11-30 NOTE — Telephone Encounter (Signed)
Mailbox full, could not leave message, letter sent to patient requesting call back. Nothing further needed.

## 2014-12-03 ENCOUNTER — Ambulatory Visit (HOSPITAL_BASED_OUTPATIENT_CLINIC_OR_DEPARTMENT_OTHER): Payer: Medicare Other | Attending: Pulmonary Disease

## 2014-12-03 VITALS — Ht 66.0 in | Wt 190.0 lb

## 2014-12-03 DIAGNOSIS — R0683 Snoring: Secondary | ICD-10-CM | POA: Diagnosis not present

## 2014-12-03 DIAGNOSIS — G4733 Obstructive sleep apnea (adult) (pediatric): Secondary | ICD-10-CM | POA: Insufficient documentation

## 2014-12-03 DIAGNOSIS — I493 Ventricular premature depolarization: Secondary | ICD-10-CM | POA: Diagnosis not present

## 2014-12-04 ENCOUNTER — Telehealth: Payer: Self-pay | Admitting: Pulmonary Disease

## 2014-12-04 DIAGNOSIS — G4733 Obstructive sleep apnea (adult) (pediatric): Secondary | ICD-10-CM

## 2014-12-04 DIAGNOSIS — G4730 Sleep apnea, unspecified: Secondary | ICD-10-CM | POA: Diagnosis not present

## 2014-12-04 NOTE — Telephone Encounter (Signed)
Pt returning call and can be reached @ [phone].Meghan GriffinsStanley A Rodgers

## 2014-12-04 NOTE — Telephone Encounter (Signed)
lmtcb

## 2014-12-04 NOTE — Progress Notes (Addendum)
Patient Name: Meghan Rodgers, Meline Study Date: 12/03/2014 Gender: Female D.O.B: 1946/04/29 Age (years): [age] Referring Provider: Not Available Height (inches): 66 Interpreting Physician: Cyril Mourningakesh Josemaria Brining MD, ABSM Weight (lbs): 190 RPSGT: Wylie HailDavis, Rico BMI: 31 MRN: [id number] Neck Size: 17.00   CLINICAL INFORMATION The patient is referred for a CPAP titration to treat sleep apnea. HST: showed severe OSA, AHI 61/h  SLEEP STUDY TECHNIQUE As per the AASM Manual for the Scoring of Sleep and Associated Events v2.3 (April 2016) with a hypopnea requiring 4% desaturations.  The channels recorded and monitored were frontal, central and occipital EEG, electrooculogram (EOG), submentalis EMG (chin), nasal and oral airflow, thoracic and abdominal wall motion, anterior tibialis EMG, snore microphone, electrocardiogram, and pulse oximetry. Continuous positive airway pressure (CPAP) was initiated at the beginning of the study and titrated to treat sleep-disordered breathing.  MEDICATIONS Medications administered by patient during sleep study : No sleep medicine administered.  TECHNICIAN COMMENTS Comments added by technician: PVCS AND SOME LIMB MOVEMENTS NOTED    RESPIRATORY PARAMETERS Optimal PAP Pressure (cm): 10 AHI at Optimal Pressure (/hr): 0.0 Overall Minimal O2 (%): 81.00 Supine % at Optimal Pressure (%): 0 Minimal O2 at Optimal Pressure (%): 91.0    SLEEP ARCHITECTURE The study was initiated at 11:07:11 PM and ended at 5:48:30 AM.  Sleep onset time was 11.3 minutes and the sleep efficiency was 80.6%. The total sleep time was 323.5 minutes.  The patient spent 9.89% of the night in stage N1 sleep, 62.75% in stage N2 sleep, 1.24% in stage N3 and 26.13% in REM.Stage REM latency was 77.0 minutes  Wake after sleep onset was 66.5. Alpha intrusion was absent. Supine sleep was 26.74%.  CARDIAC DATA The 2 lead EKG demonstrated sinus rhythm. The mean heart rate was 62.49 beats per minute. Other EKG findings  include: PVCs. LEG MOVEMENT DATA The total Periodic Limb Movements of Sleep (PLMS) were 0. The PLMS index was 0.00. A PLMS index of <15 is considered normal in adults.  IMPRESSIONS The optimal PAP pressure was 10 cm of water. Central sleep apnea was not noted during this titration (CAI = 0.0/h). Moderate oxygen desaturations were observed during this titration (min O2 = 81.00%). The patient snored with Loud snoring volume during this titration study. 2-lead EKG demonstrated: PVCs Clinically significant periodic limb movements were not noted during this study. Arousals associated with PLMs were rare.   DIAGNOSIS Obstructive Sleep Apnea (327.23 [G47.33 ICD-10])   RECOMMENDATIONS Trial of CPAP therapy on 10 cm H2O with a X-Small size Resmed Full Face Mask AirFit F10 for Her mask and heated humidification. Avoid alcohol, sedatives and other CNS depressants that may worsen sleep apnea and disrupt normal sleep architecture. Sleep hygiene should be reviewed to assess factors that may improve sleep quality. Weight management and regular exercise should be initiated or continued. Return to Sleep physician for re-evaluation after 4 weeks of therapy  Cyril Mourningakesh Nica Friske MD. [facility]. [facility]

## 2014-12-04 NOTE — Telephone Encounter (Signed)
OSA corrcted by CPAP 10 cm Pl send Rx for  - CPAP therapy on 10 cm H2O with a X-Small size Resmed Full Face Mask AirFit F10   Download in 4 wks OV in 6

## 2014-12-04 NOTE — Addendum Note (Signed)
Addended by: Oretha MilchALVA, RAKESH V on: 12/04/2014 02:32 PM   Modules accepted: Level of Service

## 2014-12-04 NOTE — Telephone Encounter (Signed)
Pt is aware her CPAP titration study results. Order has been placed for CPAP. Nothing further was needed.

## 2015-02-01 ENCOUNTER — Telehealth: Payer: Self-pay | Admitting: Pulmonary Disease

## 2015-02-01 NOTE — Telephone Encounter (Signed)
Good results on 10cm Leak OK

## 2015-02-02 NOTE — Telephone Encounter (Signed)
Patient notified.  Nothing further needed. 

## 2015-02-11 ENCOUNTER — Ambulatory Visit (HOSPITAL_BASED_OUTPATIENT_CLINIC_OR_DEPARTMENT_OTHER): Payer: Medicare Other

## 2015-02-12 ENCOUNTER — Encounter: Payer: Self-pay | Admitting: Pulmonary Disease

## 2015-02-12 ENCOUNTER — Ambulatory Visit (INDEPENDENT_AMBULATORY_CARE_PROVIDER_SITE_OTHER): Payer: Medicare Other | Admitting: Pulmonary Disease

## 2015-02-12 VITALS — BP 110/70 | HR 71 | Ht 66.75 in | Wt 200.0 lb

## 2015-02-12 DIAGNOSIS — Z23 Encounter for immunization: Secondary | ICD-10-CM

## 2015-02-12 DIAGNOSIS — G4733 Obstructive sleep apnea (adult) (pediatric): Secondary | ICD-10-CM | POA: Diagnosis not present

## 2015-02-12 NOTE — Progress Notes (Signed)
   Subjective:    Patient ID: Meghan Rodgers, female    DOB: [birth year], 69 y.o.   MRN: [id number]  HPI  69 year old female  for FU of obstructive sleep apnea. She has been noted to have loud snoring as well as witnessed apneas    Chief Complaint  Patient presents with  . Sleep Apnea    Former [facility] pt; wearing CPAP every night.  Wears hearing aids, had hearing test and they told her that her ears are worse, she wants to know if the nerve damage is related to Hypoxia.  She wants to know if the CPAP will improve her hearing.  Flu shot    HST - Severe OSA - 61/hr 11/2014 CPAP 10 cm H2O with a X-Small size Resmed Full Face Mask AirFit F10    Better rested, was able to play bridge better Adjusting well, mask ok, pr ok, no naps Wt unchanged download on 10 cm >> residual AHI 5/h, no leak, good usage  Past Medical History  Diagnosis Date  . PVC (premature ventricular contraction)     The Patient has atrial tachycardia in the past  . Nocturia   . H/O bilateral salpingo-oophorectomy     Previous laparoscopic, for adenofibromas  . Osteopenia     stable or improving  . Hepatitis B     Positive for core antigen    Review of Systems neg for any significant sore throat, dysphagia, itching, sneezing, nasal congestion or excess/ purulent secretions, fever, chills, sweats, unintended wt loss, pleuritic or exertional cp, hempoptysis, orthopnea pnd or change in chronic leg swelling. Also denies presyncope, palpitations, heartburn, abdominal pain, nausea, vomiting, diarrhea or change in bowel or urinary habits, dysuria,hematuria, rash, arthralgias, visual complaints, headache, numbness weakness or ataxia.     Objective:   Physical Exam  Gen. Pleasant, obese, in no distress ENT - no lesions, no post nasal drip Neck: No JVD, no thyromegaly, no carotid bruits Lungs: no use of accessory muscles, no dullness to percussion, decreased without rales or rhonchi  Cardiovascular: Rhythm regular, heart  sounds  normal, no murmurs or gallops, no peripheral edema Musculoskeletal: No deformities, no cyanosis or clubbing , no tremors       Assessment & Plan:

## 2015-02-12 NOTE — Patient Instructions (Signed)
CPAP 10 cm is working well Weight loss recommended

## 2015-02-12 NOTE — Assessment & Plan Note (Signed)
CPAP 10 cm very effective She is adjusting well  Weight loss encouraged, compliance with goal of at least 4-6 hrs every night is the expectation. Advised against medications with sedative side effects Cautioned against driving when sleepy - understanding that sleepiness will vary on a day to day basis

## 2015-03-13 ENCOUNTER — Encounter: Payer: Self-pay | Admitting: Pulmonary Disease

## 2015-04-25 ENCOUNTER — Encounter: Payer: Self-pay | Admitting: Adult Health

## 2015-05-08 ENCOUNTER — Telehealth: Payer: Self-pay | Admitting: Pulmonary Disease

## 2015-05-09 NOTE — Telephone Encounter (Signed)
Last OV faxed to [phone]  Called to make Westly PamLacy aware [phone] ext [phone] for Westly PamLacy to make aware

## 2015-05-10 NOTE — Telephone Encounter (Signed)
[facility] notes being sent.  Nothing further needed.

## 2015-05-15 ENCOUNTER — Ambulatory Visit: Payer: Medicare Other | Admitting: Adult Health

## 2015-05-22 ENCOUNTER — Ambulatory Visit (INDEPENDENT_AMBULATORY_CARE_PROVIDER_SITE_OTHER): Payer: Medicare Other | Admitting: Adult Health

## 2015-05-22 ENCOUNTER — Encounter: Payer: Self-pay | Admitting: Adult Health

## 2015-05-22 VITALS — BP 132/82 | HR 73 | Temp 98.3°F | Ht 66.0 in | Wt 198.0 lb

## 2015-05-22 DIAGNOSIS — G4733 Obstructive sleep apnea (adult) (pediatric): Secondary | ICD-10-CM | POA: Diagnosis not present

## 2015-05-22 NOTE — Progress Notes (Signed)
Subjective:    Patient ID: Meghan Rodgers, female    DOB: 11-Jul-1945, 70 y.o.   MRN: [id number]  HPI 70 yo followed for severe OSA   TEST  HST - Severe OSA - 61/hr 11/2014 CPAP 10 cm H2O with a X-Small size Resmed Full Face Mask AirFit F10   05/22/2015 Follow up : OSA  Returns for a three-month follow-up for severe sleep apnea. Patient says she has been doing well with her C Pap machine. Patient feels rested with decreased daytime sleepiness. Download December 4 through 05/21/2015 shows excellent compliance with average usage at 5.5 hours. She is on a set pressure of 10 cm of H2O. AHI 1.2, + leaks. She feels that her mask fits well and does not notice any leaks. Patient denies any chest pain, orthopnea, PND, or increased leg swelling.   Past Medical History  Diagnosis Date  . PVC (premature ventricular contraction)     The Patient has atrial tachycardia in the past  . Nocturia   . H/O bilateral salpingo-oophorectomy     Previous laparoscopic, for adenofibromas  . Osteopenia     stable or improving  . Hepatitis B     Positive for core antigen   Current Outpatient Prescriptions on File Prior to Visit  Medication Sig Dispense Refill  . NON FORMULARY Max GXL Daily    . NON FORMULARY Testosterone Cream daily     No current facility-administered medications on file prior to visit.     Review of Systems Constitutional:   No  weight loss, night sweats,  Fevers, chills,  +fatigue, or  lassitude.  HEENT:   No headaches,  Difficulty swallowing,  Tooth/dental problems, or  Sore throat,                No sneezing, itching, ear ache, nasal congestion, post nasal drip,   CV:  No chest pain,  Orthopnea, PND, swelling in lower extremities, anasarca, dizziness, palpitations, syncope.   GI  No heartburn, indigestion, abdominal pain, nausea, vomiting, diarrhea, change in bowel habits, loss of appetite, bloody stools.   Resp: No shortness of breath with exertion or at rest.  No excess  mucus, no productive cough,  No non-productive cough,  No coughing up of blood.  No change in color of mucus.  No wheezing.  No chest wall deformity  Skin: no rash or lesions.  GU: no dysuria, change in color of urine, no urgency or frequency.  No flank pain, no hematuria   MS:  No joint pain or swelling.  No decreased range of motion.  No back pain.  Psych:  No change in mood or affect. No depression or anxiety.  No memory loss.         Objective:   Physical Exam Filed Vitals:   05/22/15 1547  BP: 132/82  Pulse: 73  Temp: 98.3 F (36.8 C)  TempSrc: Oral  Height: 5\' 6"  (1.676 m)  Weight: 198 lb (89.812 kg)  SpO2: 96%   GEN: A/Ox3; pleasant , NAD, obese   HEENT:  [place]/AT,  EACs-clear, TMs-wnl, NOSE-clear, THROAT-clear, no lesions, no postnasal drip or exudate noted. Class 2-3 MP airway   NECK:  Supple w/ fair ROM; no JVD; normal carotid impulses w/o bruits; no thyromegaly or nodules palpated; no lymphadenopathy.  RESP  Clear  P & A; w/o, wheezes/ rales/ or rhonchi.no accessory muscle use, no dullness to percussion  CARD:  RRR, no m/r/g  , no peripheral edema, pulses intact, no cyanosis or  clubbing.  GI:   Soft & nt; nml bowel sounds; no organomegaly or masses detected.  Musco: Warm bil, no deformities or joint swelling noted.   Neuro: alert, no focal deficits noted.    Skin: Warm, no lesions or rashes         Assessment & Plan:

## 2015-05-22 NOTE — Patient Instructions (Signed)
Continue on C Pap at bedtime Goal is to wear for at least 6 hours each night. Continue to work on weight loss. Do not drive if sleepy Follow-up with Dr. Vassie LollAlva in 6 months and as needed

## 2015-05-22 NOTE — Assessment & Plan Note (Signed)
Severe sleep apnea, controlled on nocturnal C Pap  Plan  Continue on C Pap at bedtime Goal is to wear for at least 6 hours each night. Continue to work on weight loss. Do not drive if sleepy Follow-up with Dr. Vassie LollAlva in 6 months and as needed

## 2015-05-23 NOTE — Progress Notes (Signed)
Reviewed & agree with plan  

## 2015-06-03 ENCOUNTER — Inpatient Hospital Stay (HOSPITAL_BASED_OUTPATIENT_CLINIC_OR_DEPARTMENT_OTHER)
Admission: EM | Admit: 2015-06-03 | Discharge: 2015-06-07 | DRG: 202 | Disposition: A | Discharge status: Home / Self Care | Payer: Medicare Other | Attending: Internal Medicine | Admitting: Internal Medicine

## 2015-06-03 ENCOUNTER — Encounter (HOSPITAL_BASED_OUTPATIENT_CLINIC_OR_DEPARTMENT_OTHER): Payer: Self-pay | Admitting: Emergency Medicine

## 2015-06-03 ENCOUNTER — Emergency Department (HOSPITAL_BASED_OUTPATIENT_CLINIC_OR_DEPARTMENT_OTHER): Payer: Medicare Other

## 2015-06-03 DIAGNOSIS — M8580 Other specified disorders of bone density and structure, unspecified site: Secondary | ICD-10-CM | POA: Diagnosis present

## 2015-06-03 DIAGNOSIS — Z87440 Personal history of urinary (tract) infections: Secondary | ICD-10-CM

## 2015-06-03 DIAGNOSIS — A419 Sepsis, unspecified organism: Secondary | ICD-10-CM | POA: Diagnosis present

## 2015-06-03 DIAGNOSIS — R509 Fever, unspecified: Secondary | ICD-10-CM | POA: Diagnosis present

## 2015-06-03 DIAGNOSIS — N390 Urinary tract infection, site not specified: Secondary | ICD-10-CM | POA: Diagnosis present

## 2015-06-03 DIAGNOSIS — L270 Generalized skin eruption due to drugs and medicaments taken internally: Secondary | ICD-10-CM | POA: Diagnosis present

## 2015-06-03 DIAGNOSIS — J209 Acute bronchitis, unspecified: Principal | ICD-10-CM | POA: Diagnosis present

## 2015-06-03 DIAGNOSIS — N289 Disorder of kidney and ureter, unspecified: Secondary | ICD-10-CM | POA: Diagnosis present

## 2015-06-03 DIAGNOSIS — Z889 Allergy status to unspecified drugs, medicaments and biological substances status: Secondary | ICD-10-CM | POA: Diagnosis not present

## 2015-06-03 DIAGNOSIS — B1910 Unspecified viral hepatitis B without hepatic coma: Secondary | ICD-10-CM | POA: Diagnosis present

## 2015-06-03 DIAGNOSIS — R21 Rash and other nonspecific skin eruption: Secondary | ICD-10-CM | POA: Diagnosis present

## 2015-06-03 DIAGNOSIS — Z881 Allergy status to other antibiotic agents status: Secondary | ICD-10-CM

## 2015-06-03 DIAGNOSIS — J189 Pneumonia, unspecified organism: Secondary | ICD-10-CM

## 2015-06-03 DIAGNOSIS — T368X5A Adverse effect of other systemic antibiotics, initial encounter: Secondary | ICD-10-CM | POA: Diagnosis present

## 2015-06-03 DIAGNOSIS — Z8619 Personal history of other infectious and parasitic diseases: Secondary | ICD-10-CM

## 2015-06-03 DIAGNOSIS — Z87891 Personal history of nicotine dependence: Secondary | ICD-10-CM | POA: Diagnosis not present

## 2015-06-03 DIAGNOSIS — E872 Acidosis: Secondary | ICD-10-CM | POA: Diagnosis present

## 2015-06-03 DIAGNOSIS — J219 Acute bronchiolitis, unspecified: Secondary | ICD-10-CM | POA: Diagnosis not present

## 2015-06-03 DIAGNOSIS — R6889 Other general symptoms and signs: Secondary | ICD-10-CM

## 2015-06-03 DIAGNOSIS — T7840XA Allergy, unspecified, initial encounter: Secondary | ICD-10-CM

## 2015-06-03 LAB — CBC WITH DIFFERENTIAL/PLATELET
Basophils Absolute: 0 10*3/uL (ref 0.0–0.1)
Basophils Relative: 0 %
Eosinophils Absolute: 0.2 10*3/uL (ref 0.0–0.7)
Eosinophils Relative: 6 %
HEMATOCRIT: 42.8 % (ref 36.0–46.0)
HEMOGLOBIN: 14.1 g/dL (ref 12.0–15.0)
LYMPHS ABS: 0.3 10*3/uL — AB (ref 0.7–4.0)
Lymphocytes Relative: 7 %
MCH: 30.9 pg (ref 26.0–34.0)
MCHC: 32.9 g/dL (ref 30.0–36.0)
MCV: 93.9 fL (ref 78.0–100.0)
MONO ABS: 0.1 10*3/uL (ref 0.1–1.0)
MONOS PCT: 2 %
NEUTROS ABS: 2.9 10*3/uL (ref 1.7–7.7)
NEUTROS PCT: 85 %
Platelets: 142 10*3/uL — ABNORMAL LOW (ref 150–400)
RBC: 4.56 MIL/uL (ref 3.87–5.11)
RDW: 12.6 % (ref 11.5–15.5)
WBC: 3.4 10*3/uL — ABNORMAL LOW (ref 4.0–10.5)

## 2015-06-03 LAB — URINE MICROSCOPIC-ADD ON

## 2015-06-03 LAB — URINALYSIS, ROUTINE W REFLEX MICROSCOPIC
BILIRUBIN URINE: NEGATIVE
Glucose, UA: NEGATIVE mg/dL
KETONES UR: NEGATIVE mg/dL
Leukocytes, UA: NEGATIVE
NITRITE: NEGATIVE
PH: 5.5 (ref 5.0–8.0)
Protein, ur: 30 mg/dL — AB
Specific Gravity, Urine: 1.016 (ref 1.005–1.030)

## 2015-06-03 LAB — COMPREHENSIVE METABOLIC PANEL
ALK PHOS: 135 U/L — AB (ref 38–126)
ALT: 30 U/L (ref 14–54)
ANION GAP: 11 (ref 5–15)
AST: 36 U/L (ref 15–41)
Albumin: 3.9 g/dL (ref 3.5–5.0)
BILIRUBIN TOTAL: 0.4 mg/dL (ref 0.3–1.2)
BUN: 23 mg/dL — ABNORMAL HIGH (ref 6–20)
CALCIUM: 8.5 mg/dL — AB (ref 8.9–10.3)
CO2: 20 mmol/L — ABNORMAL LOW (ref 22–32)
Chloride: 101 mmol/L (ref 101–111)
Creatinine, Ser: 1.54 mg/dL — ABNORMAL HIGH (ref 0.44–1.00)
GFR, EST AFRICAN AMERICAN: 39 mL/min — AB (ref >60)
GFR, EST NON AFRICAN AMERICAN: 34 mL/min — AB (ref >60)
Glucose, Bld: 125 mg/dL — ABNORMAL HIGH (ref 65–99)
Potassium: 3.8 mmol/L (ref 3.5–5.1)
Sodium: 132 mmol/L — ABNORMAL LOW (ref 135–145)
TOTAL PROTEIN: 7.7 g/dL (ref 6.5–8.1)

## 2015-06-03 LAB — I-STAT CG4 LACTIC ACID, ED: Lactic Acid, Venous: 0.84 mmol/L (ref 0.5–2.0)

## 2015-06-03 LAB — LIPASE, BLOOD: LIPASE: 34 U/L (ref 11–51)

## 2015-06-03 LAB — INFLUENZA PANEL BY PCR (TYPE A & B)
H1N1 flu by pcr: NOT DETECTED
INFLAPCR: NEGATIVE
Influenza B By PCR: NEGATIVE

## 2015-06-03 LAB — MRSA PCR SCREENING: MRSA BY PCR: NEGATIVE

## 2015-06-03 MED ORDER — AZTREONAM 1 G IJ SOLR
INTRAMUSCULAR | Status: AC
  Administered 2015-06-03: 2 g
  Filled 2015-06-03: qty 2

## 2015-06-03 MED ORDER — SODIUM CHLORIDE 0.9 % IV SOLN
INTRAVENOUS | Status: AC
  Administered 2015-06-03: 18:00:00 via INTRAVENOUS

## 2015-06-03 MED ORDER — FAMOTIDINE IN NACL 20-0.9 MG/50ML-% IV SOLN
20.0000 mg | Freq: Once | INTRAVENOUS | Status: AC
  Administered 2015-06-03: 20 mg via INTRAVENOUS
  Filled 2015-06-03: qty 50

## 2015-06-03 MED ORDER — VANCOMYCIN HCL IN DEXTROSE 750-5 MG/150ML-% IV SOLN
750.0000 mg | Freq: Once | INTRAVENOUS | Status: AC
  Filled 2015-06-03: qty 150

## 2015-06-03 MED ORDER — DIPHENHYDRAMINE HCL 25 MG PO CAPS
25.0000 mg | ORAL_CAPSULE | Freq: Once | ORAL | Status: AC
  Administered 2015-06-03: 25 mg via ORAL
  Filled 2015-06-03: qty 1

## 2015-06-03 MED ORDER — SODIUM CHLORIDE 0.9 % IV BOLUS (SEPSIS)
1000.0000 mL | Freq: Once | INTRAVENOUS | Status: AC
  Administered 2015-06-03: 1000 mL via INTRAVENOUS

## 2015-06-03 MED ORDER — SODIUM CHLORIDE 0.9 % IV SOLN
1250.0000 mg | INTRAVENOUS | Status: DC
  Filled 2015-06-03: qty 1250

## 2015-06-03 MED ORDER — ACETAMINOPHEN 325 MG PO TABS
650.0000 mg | ORAL_TABLET | Freq: Four times a day (QID) | ORAL | Status: DC | PRN
  Administered 2015-06-04 – 2015-06-05 (×2): 650 mg via ORAL
  Filled 2015-06-03 (×2): qty 2

## 2015-06-03 MED ORDER — SODIUM CHLORIDE 0.9 % IV BOLUS (SEPSIS)
1000.0000 mL | INTRAVENOUS | Status: AC
  Administered 2015-06-03 (×2): 1000 mL via INTRAVENOUS

## 2015-06-03 MED ORDER — VANCOMYCIN HCL IN DEXTROSE 1-5 GM/200ML-% IV SOLN
INTRAVENOUS | Status: AC
  Administered 2015-06-03: 750 mg
  Filled 2015-06-03: qty 200

## 2015-06-03 MED ORDER — ACETAMINOPHEN 325 MG PO TABS
650.0000 mg | ORAL_TABLET | Freq: Once | ORAL | Status: AC
  Administered 2015-06-03: 650 mg via ORAL
  Filled 2015-06-03: qty 2

## 2015-06-03 MED ORDER — ENOXAPARIN SODIUM 40 MG/0.4ML ~~LOC~~ SOLN
40.0000 mg | SUBCUTANEOUS | Status: DC
  Administered 2015-06-03 – 2015-06-06 (×4): 40 mg via SUBCUTANEOUS
  Filled 2015-06-03 (×4): qty 0.4

## 2015-06-03 MED ORDER — VANCOMYCIN HCL IN DEXTROSE 1-5 GM/200ML-% IV SOLN
1000.0000 mg | Freq: Once | INTRAVENOUS | Status: AC
  Administered 2015-06-03: 1000 mg via INTRAVENOUS
  Filled 2015-06-03: qty 200

## 2015-06-03 MED ORDER — DEXTROSE 5 % IV SOLN: 2.0000 g | Freq: Once | INTRAVENOUS | Status: AC

## 2015-06-03 MED ORDER — IBUPROFEN 800 MG PO TABS
800.0000 mg | ORAL_TABLET | Freq: Once | ORAL | Status: AC
  Administered 2015-06-03: 800 mg via ORAL
  Filled 2015-06-03: qty 1

## 2015-06-03 MED ORDER — CALAMINE EX LOTN
TOPICAL_LOTION | CUTANEOUS | Status: DC | PRN
  Administered 2015-06-04: via TOPICAL
  Filled 2015-06-03: qty 118

## 2015-06-03 MED ORDER — DEXTROSE 5 % IV SOLN
2.0000 g | Freq: Three times a day (TID) | INTRAVENOUS | Status: DC
  Administered 2015-06-03 – 2015-06-07 (×10): 2 g via INTRAVENOUS
  Filled 2015-06-03 (×15): qty 2

## 2015-06-03 MED ORDER — METHYLPREDNISOLONE SODIUM SUCC 125 MG IJ SOLR
125.0000 mg | Freq: Once | INTRAMUSCULAR | Status: AC
  Administered 2015-06-03: 125 mg via INTRAVENOUS
  Filled 2015-06-03: qty 2

## 2015-06-03 MED ORDER — SODIUM CHLORIDE 0.9 % IV SOLN
INTRAVENOUS | Status: DC
  Administered 2015-06-04: 100 mL via INTRAVENOUS

## 2015-06-03 NOTE — ED Notes (Signed)
Pt was seen at pcp on Friday, started on cipro for possible uti, pt presents today with rash and redness all over body, pt states that she took two doses of cipro and now thinks that she is having an allergic reaction

## 2015-06-03 NOTE — H&P (Signed)
[facility] History and Physical  Meghan Rodgers MWU:[id number] DOB: 09/17/1945 DOA: 06/03/2015  Referring physician: EDP PCP: Delorse Lek, MD   Chief Complaint: Allergic reaction   HPI: Meghan Rodgers is a 70 y.o. female who presents to the ED due to what she believes to be an allergic reaction to ABx.  Patient was on a course of Septra which she just finished on Friday.  She had been having fever at home for several days despite this.  She went to her PCP on Friday and was found to have a nitrite positive UTI.  She was started on Cipro.  On Thursday patient began feeling ill with URI symptoms.  Patient took Cipro, motrin this morning.  Rash developed over entire body, she took benadryl for rash and went to ED.  In ED she was put on aztreonam and vanc, fever 103.1 noted, given solumedrol dose x1 for rash as well.  Review of Systems: Systems reviewed.  As above, otherwise negative  Past Medical History  Diagnosis Date  . PVC (premature ventricular contraction)     The Patient has atrial tachycardia in the past  . Nocturia   . H/O bilateral salpingo-oophorectomy     Previous laparoscopic, for adenofibromas  . Osteopenia     stable or improving  . Hepatitis B     Positive for core antigen   Past Surgical History  Procedure Laterality Date  . Cholecystectomy  2000  . Hernia repair  1950, 1979  . Oophorectomy  2004   Social History:  reports that she quit smoking about 22 years ago. Her smoking use included Cigarettes. She has a 20 pack-year smoking history. She does not have any smokeless tobacco history on file. She reports that she drinks alcohol. She reports that she does not use illicit drugs.  Allergies  Allergen Reactions  . Amoxicillin     Other reaction(s): Hives  . Ilosone [Erythromycin] Rash    Other reaction(s): Diarrhea, Hives  . Augmentin [Amoxicillin-Pot Clavulanate] Rash  . Ampicillin   . Naproxen   . Ciprofloxacin Rash    Family History  Problem  Relation Age of Onset  . Heart disease Father   . Cancer Mother      Prior to Admission medications   Medication Sig Start Date End Date Taking? Authorizing Provider  NON FORMULARY Max GXL Daily    Historical Provider, MD  NON FORMULARY Testosterone Cream daily    Historical Provider, MD   Physical Exam: Filed Vitals:   06/03/15 1800 06/03/15 1946  BP: 108/57 125/68  Pulse: 80   Temp: 98.7 F (37.1 C) 97.7 F (36.5 C)  Resp: 21     BP 125/68 mmHg  Pulse 80  Temp(Src) 97.7 F (36.5 C) (Oral)  Resp 21  Ht 5\' 6"  (1.676 m)  Wt 92.488 kg (203 lb 14.4 oz)  BMI 32.93 kg/m2  SpO2 95%  General Appearance:    Alert, oriented, no distress, appears stated age  Head:    Normocephalic, atraumatic  Eyes:    PERRL, EOMI, sclera non-icteric        Nose:   Nares without drainage or epistaxis. Mucosa, turbinates normal  Throat:   Moist mucous membranes. Oropharynx without erythema or exudate.  Neck:   Supple. No carotid bruits.  No thyromegaly.  No lymphadenopathy.   Back:     No CVA tenderness, no spinal tenderness  Lungs:     Clear to auscultation bilaterally, without wheezes, rhonchi or rales  Chest wall:  No tenderness to palpitation  Heart:    Regular rate and rhythm without murmurs, gallops, rubs  Abdomen:     Soft, non-tender, nondistended, normal bowel sounds, no organomegaly  Genitalia:    deferred  Rectal:    deferred  Extremities:   No clubbing, cyanosis or edema.  Pulses:   2+ and symmetric all extremities  Skin:   Skin color, texture, turgor normal, no rashes or lesions  Lymph nodes:   Cervical, supraclavicular, and axillary nodes normal  Neurologic:   CNII-XII intact. Normal strength, sensation and reflexes      throughout    Labs on Admission:  Basic Metabolic Panel:  Recent Labs Lab 06/03/15 1323  NA 132*  K 3.8  CL 101  CO2 20*  GLUCOSE 125*  BUN 23*  CREATININE 1.54*  CALCIUM 8.5*   Liver Function Tests:  Recent Labs Lab 06/03/15 1323  AST 36   ALT 30  ALKPHOS 135*  BILITOT 0.4  PROT 7.7  ALBUMIN 3.9    Recent Labs Lab 06/03/15 1323  LIPASE 34   No results for input(s): AMMONIA in the last 168 hours. CBC:  Recent Labs Lab 06/03/15 1323  WBC 3.4*  NEUTROABS 2.9  HGB 14.1  HCT 42.8  MCV 93.9  PLT 142*   Cardiac Enzymes: No results for input(s): CKTOTAL, CKMB, CKMBINDEX, TROPONINI in the last 168 hours.  BNP (last 3 results) No results for input(s): PROBNP in the last 8760 hours. CBG: No results for input(s): GLUCAP in the last 168 hours.  Radiological Exams on Admission: Dg Chest 2 View  06/03/2015  CLINICAL DATA:  Fever 3 days. EXAM: CHEST  2 VIEW COMPARISON:  None. FINDINGS: Mild cardiomegaly. Normal vascularity. Clear lungs. No pleural effusion or pneumothorax. Mild wedging of an upper thoracic vertebral body has a chronic appearance. IMPRESSION: Cardiomegaly without decompensation. Electronically Signed   By: Jolaine ClickArthur  Hoss M.D.   On: 06/03/2015 13:30    EKG: Independently reviewed.  Assessment/Plan Principal Problem:   Fever Active Problems:   Rash and nonspecific skin eruption   UTI (urinary tract infection)   1. Fever and rash - improved since onset 1. Could be viral syndrome 2. Due to time of year, vector born illnesses (RMSF, tick born, dengue, etc) are extremely unlikely.  Patient denies any recent travel.  There were 8 inches of snow on the ground in the local area 7 days ago. 3. Will leave patient on aztreonam and vanc per now as ordered by Dr. Criselda PeachesMullen 4. Calamine lotion ordered for rash 5. Will hold off on ordering further steroids for now and see if this progresses. 6. Have stopped Cipro and added to allergies list 2. UTI - 1. Cultures pending, UA didn't look infected today though 2. Is currently on aztreonam and vanc    Code Status: Full  Family Communication: No family in room Disposition Plan: Admit to inpatient, will transfer to med-surg bed given stable vital signs.   Time  spent: 70 min  GARDNER, JARED M. [facility] Pager [phone]  If 7AM-7PM, please contact the day team taking care of the patient [email or web address] Password [email or web address] 06/03/2015, 8:52 PM

## 2015-06-03 NOTE — ED Provider Notes (Signed)
CSN: [id number]     Arrival date & time 06/03/15  1206 History   First MD Initiated Contact with Patient 06/03/15 1220     Chief Complaint  Patient presents with  . Allergic Reaction     (Consider location/radiation/quality/duration/timing/severity/associated sxs/prior Treatment) Patient is a 70 y.o. female presenting with allergic reaction. The history is provided by the patient.  Allergic Reaction Presenting symptoms: rash   Presenting symptoms: no wheezing    patient presents with rash that she feels is related to an allergic reaction to antibiotics. Patient still on a course of Septra and was started on a course of Cipro on Friday by her primary care doctor for a positive nitrite UTI. In addition on Thursday patient started feeling bad. Did have some upper respiratory type symptoms. Some flulike symptoms. Fever cough nausea but no vomiting no diarrhea. No dysuria. Patient has a extensive history of allergies to antibiotics to include predominantly penicillins. Patient took Motrin this morning for the bodyaches and also took Benadryl when the rash developed. Patient states fevers been up to 102 at home.  Past Medical History  Diagnosis Date  . PVC (premature ventricular contraction)     The Patient has atrial tachycardia in the past  . Nocturia   . H/O bilateral salpingo-oophorectomy     Previous laparoscopic, for adenofibromas  . Osteopenia     stable or improving  . Hepatitis B     Positive for core antigen   Past Surgical History  Procedure Laterality Date  . Cholecystectomy  2000  . Hernia repair  1950, 1979  . Oophorectomy  2004   Family History  Problem Relation Age of Onset  . Heart disease Father   . Cancer Mother    Social History  Substance Use Topics  . Smoking status: Former Smoker -- 1.00 packs/day for 20 years    Types: Cigarettes    Quit date: 05/19/1993  . Smokeless tobacco: None     Comment: smoked for 20 years off and on  . Alcohol Use: 0.0 oz/week     0 Standard drinks or equivalent per week     Comment: 2 glasses a week   OB History    No data available     Review of Systems  Constitutional: Positive for fever.  HENT: Positive for congestion.   Eyes: Positive for redness. Negative for visual disturbance.  Respiratory: Positive for cough and shortness of breath. Negative for wheezing.   Cardiovascular: Negative for chest pain.  Gastrointestinal: Positive for nausea. Negative for vomiting, abdominal pain and diarrhea.  Genitourinary: Negative for dysuria and hematuria.  Musculoskeletal: Positive for myalgias.  Skin: Positive for rash.  Neurological: Negative for headaches.  Hematological: Does not bruise/bleed easily.  Psychiatric/Behavioral: Negative for confusion.      Allergies  Amoxicillin; Ilosone; Augmentin; Ampicillin; Naproxen; and Ciprofloxacin  Home Medications   Prior to Admission medications   Medication Sig Start Date End Date Taking? Authorizing Provider  NON FORMULARY Max GXL Daily    Historical Provider, MD  NON FORMULARY Testosterone Cream daily    Historical Provider, MD   BP 123/66 mmHg  Pulse 98  Temp(Src) 102.5 F (39.2 C) (Oral)  Resp 25  Ht 5\' 6"  (1.676 m)  Wt 88.451 kg  BMI 31.49 kg/m2  SpO2 95% Physical Exam  Constitutional: She is oriented to person, place, and time. She appears well-developed and well-nourished. No distress.  HENT:  Head: Normocephalic and atraumatic.  Mouth/Throat: No oropharyngeal exudate.  Mucous membranes slightly  dry.  Eyes: Conjunctivae and EOM are normal. Pupils are equal, round, and reactive to light.  Neck: Normal range of motion. Neck supple.  Pulmonary/Chest: Effort normal and breath sounds normal. No respiratory distress.  Abdominal: Soft. Bowel sounds are normal. There is no tenderness.  Musculoskeletal: Normal range of motion.  Neurological: She is alert and oriented to person, place, and time. No cranial nerve deficit. She exhibits normal muscle  tone. Coordination normal.  Skin: Skin is warm. Rash noted. There is erythema.  Erythematous rash throughout most of the body predominantly on the back and on the face. No target lesions no bullae. Rash does blanch. No hives.  Nursing note and vitals reviewed.   ED Course  Procedures (including critical care time) Labs Review Labs Reviewed  CBC WITH DIFFERENTIAL/PLATELET - Abnormal; Notable for the following:    WBC 3.4 (*)    Platelets 142 (*)    Lymphs Abs 0.3 (*)    All other components within normal limits  COMPREHENSIVE METABOLIC PANEL - Abnormal; Notable for the following:    Sodium 132 (*)    CO2 20 (*)    Glucose, Bld 125 (*)    BUN 23 (*)    Creatinine, Ser 1.54 (*)    Calcium 8.5 (*)    Alkaline Phosphatase 135 (*)    GFR calc non Af Amer 34 (*)    GFR calc Af Amer 39 (*)    All other components within normal limits  URINALYSIS, ROUTINE W REFLEX MICROSCOPIC (NOT AT [facility]) - Abnormal; Notable for the following:    APPearance CLOUDY (*)    Hgb urine dipstick MODERATE (*)    Protein, ur 30 (*)    All other components within normal limits  URINE MICROSCOPIC-ADD ON - Abnormal; Notable for the following:    Squamous Epithelial / LPF 0-5 (*)    Bacteria, UA MANY (*)    Casts HYALINE CASTS (*)    All other components within normal limits  CULTURE, BLOOD (ROUTINE X 2)  CULTURE, BLOOD (ROUTINE X 2)  URINE CULTURE  LIPASE, BLOOD  INFLUENZA PANEL BY PCR (TYPE A & B, H1N1)  I-STAT CG4 LACTIC ACID, ED  I-STAT CG4 LACTIC ACID, ED   Results for orders placed or performed during the hospital encounter of 06/03/15  CBC with Differential/Platelet  Result Value Ref Range   WBC 3.4 (L) 4.0 - 10.5 K/uL   RBC 4.56 3.87 - 5.11 MIL/uL   Hemoglobin 14.1 12.0 - 15.0 g/dL   HCT 54.042.8 98.136.0 - 19.146.0 %   MCV 93.9 78.0 - 100.0 fL   MCH 30.9 26.0 - 34.0 pg   MCHC 32.9 30.0 - 36.0 g/dL   RDW 47.812.6 29.511.5 - 62.115.5 %   Platelets 142 (L) 150 - 400 K/uL   Neutrophils Relative % 85 %   Neutro Abs  2.9 1.7 - 7.7 K/uL   Lymphocytes Relative 7 %   Lymphs Abs 0.3 (L) 0.7 - 4.0 K/uL   Monocytes Relative 2 %   Monocytes Absolute 0.1 0.1 - 1.0 K/uL   Eosinophils Relative 6 %   Eosinophils Absolute 0.2 0.0 - 0.7 K/uL   Basophils Relative 0 %   Basophils Absolute 0.0 0.0 - 0.1 K/uL  Comprehensive metabolic panel  Result Value Ref Range   Sodium 132 (L) 135 - 145 mmol/L   Potassium 3.8 3.5 - 5.1 mmol/L   Chloride 101 101 - 111 mmol/L   CO2 20 (L) 22 - 32 mmol/L  Glucose, Bld 125 (H) 65 - 99 mg/dL   BUN 23 (H) 6 - 20 mg/dL   Creatinine, Ser 5.62 (H) 0.44 - 1.00 mg/dL   Calcium 8.5 (L) 8.9 - 10.3 mg/dL   Total Protein 7.7 6.5 - 8.1 g/dL   Albumin 3.9 3.5 - 5.0 g/dL   AST 36 15 - 41 U/L   ALT 30 14 - 54 U/L   Alkaline Phosphatase 135 (H) 38 - 126 U/L   Total Bilirubin 0.4 0.3 - 1.2 mg/dL   GFR calc non Af Amer 34 (L) >60 mL/min   GFR calc Af Amer 39 (L) >60 mL/min   Anion gap 11 5 - 15  Lipase, blood  Result Value Ref Range   Lipase 34 11 - 51 U/L  Urinalysis, Routine w reflex microscopic (not at [facility])  Result Value Ref Range   Color, Urine YELLOW YELLOW   APPearance CLOUDY (A) CLEAR   Specific Gravity, Urine 1.016 1.005 - 1.030   pH 5.5 5.0 - 8.0   Glucose, UA NEGATIVE NEGATIVE mg/dL   Hgb urine dipstick MODERATE (A) NEGATIVE   Bilirubin Urine NEGATIVE NEGATIVE   Ketones, ur NEGATIVE NEGATIVE mg/dL   Protein, ur 30 (A) NEGATIVE mg/dL   Nitrite NEGATIVE NEGATIVE   Leukocytes, UA NEGATIVE NEGATIVE  Urine microscopic-add on  Result Value Ref Range   Squamous Epithelial / LPF 0-5 (A) NONE SEEN   WBC, UA 0-5 0 - 5 WBC/hpf   RBC / HPF 6-30 0 - 5 RBC/hpf   Bacteria, UA MANY (A) NONE SEEN   Casts HYALINE CASTS (A) NEGATIVE   Urine-Other MUCOUS PRESENT      Imaging Review Dg Chest 2 View  06/03/2015  CLINICAL DATA:  Fever 3 days. EXAM: CHEST  2 VIEW COMPARISON:  None. FINDINGS: Mild cardiomegaly. Normal vascularity. Clear lungs. No pleural effusion or pneumothorax. Mild  wedging of an upper thoracic vertebral body has a chronic appearance. IMPRESSION: Cardiomegaly without decompensation. Electronically Signed   By: Jolaine Click M.D.   On: 06/03/2015 13:30   I have personally reviewed and evaluated these images and lab results as part of my medical decision-making.   EKG Interpretation   Date/Time:  Sunday June 03 2015 15:04:48 EST Ventricular Rate:  96 PR Interval:  180 QRS Duration: 62 QT Interval:  385 QTC Calculation: 486 R Axis:   20 Text Interpretation:  Sinus rhythm Low voltage, precordial leads  Borderline T abnormalities, anterior leads Borderline prolonged QT  interval Baseline wander in lead(s) I V3 V4 V5 V6 Confirmed by Kailer Heindel   MD, [place] ([id number]) on 06/03/2015 3:07:26 PM       CRITICAL CARE Performed by: Vanetta Mulders Total critical care time: 30 minutes Critical care time was exclusive of separately billable procedures and treating other patients. Critical care was necessary to treat or prevent imminent or life-threatening deterioration. Critical care was time spent personally by me on the following activities: development of treatment plan with patient and/or surrogate as well as nursing, discussions with consultants, evaluation of patient's response to treatment, examination of patient, obtaining history from patient or surrogate, ordering and performing treatments and interventions, ordering and review of laboratory studies, ordering and review of radiographic studies, pulse oximetry and re-evaluation of patient's condition.    MDM   Final diagnoses:  Allergic reaction caused by a drug  Flu-like symptoms  Sepsis, due to unspecified organism [facility])    Patient with a complicated picture. Patient on Thursday with onset with upper respiratory infection or  perhaps flulike illness. Friday seen by primary care provider and started on Cipro for possible urinary tract infection could urine was nitrite positive. Patient was already  on a course of Septra for possible urinary tract infection. Today patient broke out with a rash all over her body. Nontarget nonbullous non-hives. Felt to be an allergic reaction perhaps to the Cipro because she has a lot of allergies to antibiotics but that is not confirmed. Patient reported fever temp urine initially was 98 but then temp spike to 103. Associated with some mild tachycardia no hypotension. Oxygen saturation saturations have been normal. Flu test is pending. Chest x-ray was negative. Lactic acid was elevated at 2.33. Basic electrolytes showed evidence of some acidosis.  Patient treated for an allergic reaction with Pepcid and site Medrol. Patient was already taking Benadryl. And had 2 doses this morning. In addition due to the lactic acid and the fever although not hypotensive all start broad-spectrum antibiotics. Levaquin was held due to the possible Cipro allergy. Patient in addition has a penicillin allergy. Blood cultures pending urine culture pending. Urinalysis here today was nitrite negative but did have a lot of bacteria present.  Patient will be admitted for possible early sepsis. Symptoms may very well just be viral basting could be influenza. The rash could or could not be related to an allergic reaction. Patient's mentating fine. No abdominal complaints. No leukocytosis. Making this more suggestive of viral illness.  The patient to receive a total of 3 L of fluids. Already has received 1 L. Prior to putting in the septic orders.  Will require admission will discuss with hospitalist for step down admission.      Vanetta Mulders, MD 06/03/15 1515

## 2015-06-03 NOTE — ED Notes (Addendum)
Lactic acid 2.33 mmol/L Clerical error entering patient data, POC will update.

## 2015-06-03 NOTE — Progress Notes (Signed)
ANTIBIOTIC CONSULT NOTE - INITIAL  Pharmacy Consult for azactam, vancomycin Indication: rule out sepsis  Allergies  Allergen Reactions  . Amoxicillin     Other reaction(s): Hives  . Ilosone [Erythromycin] Rash    Other reaction(s): Diarrhea, Hives  . Augmentin [Amoxicillin-Pot Clavulanate] Rash  . Ampicillin   . Naproxen   . Ciprofloxacin Rash    Patient Measurements: Height: 5\' 6"  (167.6 cm) Weight: 195 lb (88.451 kg) IBW/kg (Calculated) : 59.3  Vital Signs: Temp: 103.1 F (39.5 C) (01/15 1414) Temp Source: Oral (01/15 1414) BP: 121/61 mmHg (01/15 1414) Pulse Rate: 102 (01/15 1414) Intake/Output from previous day:   Intake/Output from this shift: Total I/O In: 50 [IV Piggyback:50] Out: -   Labs:  Recent Labs  06/03/15 1323  WBC 3.4*  HGB 14.1  PLT 142*  CREATININE 1.54*   Estimated Creatinine Clearance: 39.7 mL/min (by C-G formula based on Cr of 1.54). No results for input(s): VANCOTROUGH, VANCOPEAK, VANCORANDOM, GENTTROUGH, GENTPEAK, GENTRANDOM, TOBRATROUGH, TOBRAPEAK, TOBRARND, AMIKACINPEAK, AMIKACINTROU, AMIKACIN in the last 72 hours.   Microbiology: No results found for this or any previous visit (from the past 720 hour(s)).  Medical History: Past Medical History  Diagnosis Date  . PVC (premature ventricular contraction)     The Patient has atrial tachycardia in the past  . Nocturia   . H/O bilateral salpingo-oophorectomy     Previous laparoscopic, for adenofibromas  . Osteopenia     stable or improving  . Hepatitis B     Positive for core antigen    Assessment: 70 [facility] begin azactam and vancomycin for possible sepsis (noted on cipro for UT as outpt). -WBC= 3.4, tmax= 103.1, SCr= 1.54 (SCr was 0.9 in 2013) and CrCl ~ 40 -2gm azactam and 1000mg  og vancomycin [facility]  1/15 azactam 1/15 vanc  1/15 urine 1/15 blood x2   Goal of Therapy:  Vancomycin trough level 15-20 mcg/ml  Plan:  -Azactam 1gm IV  q8h -Vancomcyin 750mg  IV x1 (for total load of 1750mg  IV) followed by 1250mg  IV q24hr -Will follow renal function, cultures and clinical progress  Harland Germanndrew Lety Cullens, Pharm D 06/03/2015 2:56 PM

## 2015-06-03 NOTE — Progress Notes (Signed)
Notified md  Per pt's request for something to help her sleep.  [name], [name]

## 2015-06-03 NOTE — Progress Notes (Signed)
[facility] accept Note  Patient started feeling bad on Thursday with SOB and flu like symptoms (cough).  Developed fevers at home to 102F.  Saw PCP on Friday and + nitrite in urine and was started on Ciprofloxacin (in addition to Septra for a UTI).  Today, she developed erythematous rash, no hives, all over her body.  Assumed it was an allergic reaction and took benadryl and arrived at ED.  Temp went to 103F in the ED.  Tachycardia.  BP okay.  Solumedrol and pepcid in the ED.  CXR normal.  Urine + bacteria, - nitrite.  WBC 3.4.  BUN/Cr 23/1.54.  Lactic acid 2.33.  Received 1L fluid.  Mentating fine and blood pressure has been stable throughout ED stay.    DDx: sepsis vs. Allergic reaction.  Started on Abx for sepsis with vancomycin and aztreonam.    Accepted to a SDU bed because of severe acute illness.    Debe CoderMULLEN, Stephan Nelis, MD [phone]

## 2015-06-04 LAB — CBC
HCT: 35.6 % — ABNORMAL LOW (ref 36.0–46.0)
Hemoglobin: 12.1 g/dL (ref 12.0–15.0)
MCH: 31.3 pg (ref 26.0–34.0)
MCHC: 34 g/dL (ref 30.0–36.0)
MCV: 92.2 fL (ref 78.0–100.0)
PLATELETS: 97 10*3/uL — AB (ref 150–400)
RBC: 3.86 MIL/uL — AB (ref 3.87–5.11)
RDW: 12.8 % (ref 11.5–15.5)
WBC: 2.1 10*3/uL — AB (ref 4.0–10.5)

## 2015-06-04 LAB — BASIC METABOLIC PANEL
ANION GAP: 9 (ref 5–15)
BUN: 13 mg/dL (ref 6–20)
CO2: 14 mmol/L — ABNORMAL LOW (ref 22–32)
Calcium: 7.8 mg/dL — ABNORMAL LOW (ref 8.9–10.3)
Chloride: 115 mmol/L — ABNORMAL HIGH (ref 101–111)
Creatinine, Ser: 1.04 mg/dL — ABNORMAL HIGH (ref 0.44–1.00)
GFR calc Af Amer: >60 mL/min (ref >60)
GFR, EST NON AFRICAN AMERICAN: 54 mL/min — AB (ref >60)
Glucose, Bld: 190 mg/dL — ABNORMAL HIGH (ref 65–99)
POTASSIUM: 4.9 mmol/L (ref 3.5–5.1)
SODIUM: 138 mmol/L (ref 135–145)

## 2015-06-04 LAB — URINE CULTURE: Culture: NO GROWTH

## 2015-06-04 LAB — I-STAT CG4 LACTIC ACID, ED: Lactic Acid, Venous: 2.33 mmol/L (ref 0.5–2.0)

## 2015-06-04 MED ORDER — ZOLPIDEM TARTRATE 5 MG PO TABS
5.0000 mg | ORAL_TABLET | Freq: Every evening | ORAL | Status: DC | PRN
  Administered 2015-06-04: 5 mg via ORAL
  Filled 2015-06-04: qty 1

## 2015-06-04 MED ORDER — VANCOMYCIN HCL 10 G IV SOLR
1250.0000 mg | Freq: Two times a day (BID) | INTRAVENOUS | Status: DC
  Administered 2015-06-04: 1250 mg via INTRAVENOUS
  Filled 2015-06-04 (×2): qty 1250

## 2015-06-04 MED ORDER — PREDNISONE 20 MG PO TABS
20.0000 mg | ORAL_TABLET | Freq: Every day | ORAL | Status: DC
  Administered 2015-06-04 – 2015-06-05 (×2): 20 mg via ORAL
  Filled 2015-06-04 (×2): qty 1

## 2015-06-04 MED ORDER — DIPHENHYDRAMINE HCL 25 MG PO CAPS: 25.0000 mg | ORAL_CAPSULE | Freq: Four times a day (QID) | ORAL | Status: DC | PRN

## 2015-06-04 MED ORDER — ZOLPIDEM TARTRATE 5 MG PO TABS
5.0000 mg | ORAL_TABLET | Freq: Once | ORAL | Status: AC
  Administered 2015-06-04: 5 mg via ORAL
  Filled 2015-06-04: qty 1

## 2015-06-04 NOTE — Progress Notes (Signed)
Order to transfer noted, room 6E05 available, Pt and family informed and verbalized understanding. Report called to 6E RN. Belongings packed, pt transported to new room via wheelchair. VSS.

## 2015-06-04 NOTE — Progress Notes (Signed)
Patient arrived on unit via wheelchair from [facility].  No family at bedside.

## 2015-06-04 NOTE — Progress Notes (Signed)
[place] TEAM 1 - Stepdown/ICU TEAM PROGRESS NOTE  Romilda GarretGayle A Holsapple ZOX:[id number] DOB: 1945/07/04 DOA: 06/03/2015 PCP: Delorse LekBURNETT,BRENT A, MD  Admit HPI / Brief Narrative: 70 y.o. female who presented to the ED due to what she believed to be an allergic reaction to ABx. Patient was on a course of Septra which she finished. Despite this she had been having fever at home for several days.  She went to her PCP and was found to have a nitrite positive UTI. She was started on Cipro. A few days after starting the Cipro a rash developed over her entire body.  She took benadryl and went to ED.  In the ED she was found to have a fever of 103.1.  HPI/Subjective: The patient is resting comfortably in bed.  She states her erythematous rash has not changed.  I am not able to appreciate a rash on her arms or legs but she is in fact diffusely red across her back and the majority of her chest.  She denies chest pain nausea vomiting abdominal pain diarrhea.  She does state she has a dry hacking cough.  Assessment/Plan:  FUO Tmax 103.1 at presentation - has not had a recurrence to that level - no localizing sx - flu negative - CXR w/o focal infiltrate - UA not c/w infection   Acute renal insufficiency GFR 35 at presentation w/ crt 1.54 - now signif improved w/ volume - follow   Rash  Does appear to be a drug erruption - ?if this is also the source of the fever - dose w/ 3 days of prednisone and follow - no blistering   UTI? UA at admit not c/w active UTI - no growth on culture   Code Status: FULL Family Communication: Husband present at time of exam Disposition Plan: Transfer to medical bed - follow-up chest x-ray in a.m. - possible discharge 24-48 hours  Consultants: none  Procedures: none  Antibiotics: Aztreonam 1/15 > Vanc 1/15 > 1/16  DVT prophylaxis: lovenox   Objective: Blood pressure 148/74, pulse 80, temperature 99 F (37.2 C), temperature source Oral, resp. rate 19, height 5'  6" (1.676 m), weight 92.488 kg (203 lb 14.4 oz), SpO2 95 %.  Intake/Output Summary (Last 24 hours) at 06/04/15 0824 Last data filed at 06/04/15 [id number]  Gross per 24 hour  Intake   4825 ml  Output   3375 ml  Net   1450 ml   Exam: General: No acute respiratory distress - diffuse erthematous rash across back/chest - no blistering  Lungs: Clear to auscultation bilaterally without wheezes or crackles Cardiovascular: Regular rate and rhythm without murmur gallop or rub normal S1 and S2 Abdomen: Nontender, nondistended, soft, bowel sounds positive, no rebound, no ascites, no appreciable mass Extremities: No significant cyanosis, clubbing, or edema bilateral lower extremities  Data Reviewed:  Basic Metabolic Panel:  Recent Labs Lab 06/03/15 1323 06/04/15 0240  NA 132* 138  K 3.8 4.9  CL 101 115*  CO2 20* 14*  GLUCOSE 125* 190*  BUN 23* 13  CREATININE 1.54* 1.04*  CALCIUM 8.5* 7.8*    CBC:  Recent Labs Lab 06/03/15 1323 06/04/15 0240  WBC 3.4* 2.1*  NEUTROABS 2.9  --   HGB 14.1 12.1  HCT 42.8 35.6*  MCV 93.9 92.2  PLT 142* 97*    Liver Function Tests:  Recent Labs Lab 06/03/15 1323  AST 36  ALT 30  ALKPHOS 135*  BILITOT 0.4  PROT 7.7  ALBUMIN 3.9  Recent Labs Lab 06/03/15 1323  LIPASE 34    Recent Results (from the past 240 hour(s))  Culture, blood (Routine X 2) w Reflex to ID Panel     Status: None (Preliminary result)   Collection Time: 06/03/15  2:40 PM  Result Value Ref Range Status   Specimen Description BLOOD LEFT ANTECUBITAL  Final   Special Requests   Final    BOTTLES DRAWN AEROBIC AND ANAEROBIC 5.5CC Performed at [facility]    Culture PENDING  Incomplete   Report Status PENDING  Incomplete  Culture, blood (Routine X 2) w Reflex to ID Panel     Status: None (Preliminary result)   Collection Time: 06/03/15  2:55 PM  Result Value Ref Range Status   Specimen Description BLOOD RIGHT HAND  Final   Special Requests   Final     BOTTLES DRAWN AEROBIC AND ANAEROBIC 5.5CC Performed at [facility]    Culture PENDING  Incomplete   Report Status PENDING  Incomplete  MRSA PCR Screening     Status: None   Collection Time: 06/03/15  9:00 PM  Result Value Ref Range Status   MRSA by PCR NEGATIVE NEGATIVE Final    Comment:        The GeneXpert MRSA Assay (FDA approved for NASAL specimens only), is one component of a comprehensive MRSA colonization surveillance program. It is not intended to diagnose MRSA infection nor to guide or monitor treatment for MRSA infections.      Studies:   Recent x-ray studies have been reviewed in detail by the Attending Physician  Scheduled Meds:  Scheduled Meds: . aztreonam  2 g Intravenous Q8H  . enoxaparin (LOVENOX) injection  40 mg Subcutaneous Q24H  . vancomycin  1,250 mg Intravenous Q24H    Time spent on care of this patient: 35 mins   Suhaas Agena T , MD   [facility] Office  [phone] Pager - Text Page per Loretha Stapler as per below:  On-Call/Text Page:      [email or web address]      password [email or web address]  If 7PM-7AM, please contact night-coverage [email or web address] Password [email or web address] 06/04/2015, 8:24 AM   LOS: 1 day

## 2015-06-04 NOTE — Care Management Note (Signed)
Case Management Note  Patient Details  Name: Meghan Rodgers MRN: [id number] Date of Birth: [birth year]  Subjective/Objective:      Adm w fever              Action/Plan:lives w husband, pcp dr Rosezetta Schlatterburnette  Expected Discharge Date:                Expected Discharge Plan:     In-House Referral:     Discharge planning Services     Post Acute Care Choice:    Choice offered to:     DME Arranged:    DME Agency:     HH Arranged:    HH Agency:     Status of Service:     Medicare Important Message Given:    Date Medicare IM Given:    Medicare IM give by:    Date Additional Medicare IM Given:    Additional Medicare Important Message give by:     If discussed at Long Length of Stay Meetings, dates discussed:    Additional Comments: ur review done  Hanley HaysDowell, Montrel Donahoe T, RN 06/04/2015, 10:00 AM

## 2015-06-04 NOTE — Progress Notes (Signed)
ANTIBIOTIC CONSULT NOTE   Pharmacy Consult for azactam, vancomycin Indication: rule out sepsis  Allergies  Allergen Reactions  . Amoxicillin     Other reaction(s): Hives  . Ilosone [Erythromycin] Rash    Other reaction(s): Diarrhea, Hives  . Augmentin [Amoxicillin-Pot Clavulanate] Rash  . Ampicillin   . Naproxen   . Ciprofloxacin Rash    Patient Measurements: Height: 5\' 6"  (167.6 cm) Weight: 203 lb 14.4 oz (92.488 kg) IBW/kg (Calculated) : 59.3  Vital Signs: Temp: 99 F (37.2 C) (01/16 0739) Temp Source: Oral (01/16 0739) BP: 148/74 mmHg (01/16 0739) Pulse Rate: 80 (01/16 0739) Intake/Output from previous day: 01/15 0701 - 01/16 0700 In: 4825 [P.O.:120; I.V.:1155; IV Piggyback:3550] Out: 3375 [Urine:3375] Intake/Output from this shift:    Labs:  Recent Labs  06/03/15 1323 06/04/15 0240  WBC 3.4* 2.1*  HGB 14.1 12.1  PLT 142* 97*  CREATININE 1.54* 1.04*   Estimated Creatinine Clearance: 60.2 mL/min (by C-G formula based on Cr of 1.04). No results for input(s): VANCOTROUGH, VANCOPEAK, VANCORANDOM, GENTTROUGH, GENTPEAK, GENTRANDOM, TOBRATROUGH, TOBRAPEAK, TOBRARND, AMIKACINPEAK, AMIKACINTROU, AMIKACIN in the last 72 hours.   Microbiology: Recent Results (from the past 720 hour(s))  Culture, blood (Routine X 2) w Reflex to ID Panel     Status: None (Preliminary result)   Collection Time: 06/03/15  2:40 PM  Result Value Ref Range Status   Specimen Description BLOOD LEFT ANTECUBITAL  Final   Special Requests   Final    BOTTLES DRAWN AEROBIC AND ANAEROBIC 5.5CC Performed at [facility]    Culture PENDING  Incomplete   Report Status PENDING  Incomplete  Culture, blood (Routine X 2) w Reflex to ID Panel     Status: None (Preliminary result)   Collection Time: 06/03/15  2:55 PM  Result Value Ref Range Status   Specimen Description BLOOD RIGHT HAND  Final   Special Requests   Final    BOTTLES DRAWN AEROBIC AND ANAEROBIC 5.5CC Performed at [facility]    Culture PENDING  Incomplete   Report Status PENDING  Incomplete  MRSA PCR Screening     Status: None   Collection Time: 06/03/15  9:00 PM  Result Value Ref Range Status   MRSA by PCR NEGATIVE NEGATIVE Final    Comment:        The GeneXpert MRSA Assay (FDA approved for NASAL specimens only), is one component of a comprehensive MRSA colonization surveillance program. It is not intended to diagnose MRSA infection nor to guide or monitor treatment for MRSA infections.     Medical History: Past Medical History  Diagnosis Date  . PVC (premature ventricular contraction)     The Patient has atrial tachycardia in the past  . Nocturia   . H/O bilateral salpingo-oophorectomy     Previous laparoscopic, for adenofibromas  . Osteopenia     stable or improving  . Hepatitis B     Positive for core antigen    Assessment: 70 yo female at [facility] to begin azactam and vancomycin for possible sepsis (noted on cipro for UT as outpt). -WBC= 3.4, tmax= 103.1, SCr= 1.54 (SCr was 0.9 in 2013) and CrCl ~ 40 -2gm azactam and 1000mg  og vancomycin has been ordered at [facility]  1/16 > Scr improved today to 1.05, est CrCl ~ 60 ml/min  1/15 azactam 1/15 vanc  1/15 urine > 1/15 blood x2 >   Goal of Therapy:  Vancomycin trough level 15-20 mcg/ml  Plan:  -Continue Azactam 1gm IV q8h -  Increase vancomycin to 1250 mg IV q 12 hrs for now. -Will follow renal function, cultures and clinical progress  Tad MooreJessica Vayla Wilhelmi, Pharm D, BCPS  Clinical Pharmacist Pager [phone]  06/04/2015 9:14 AM

## 2015-06-05 ENCOUNTER — Inpatient Hospital Stay (HOSPITAL_COMMUNITY): Payer: Medicare Other

## 2015-06-05 DIAGNOSIS — J219 Acute bronchiolitis, unspecified: Secondary | ICD-10-CM

## 2015-06-05 DIAGNOSIS — A419 Sepsis, unspecified organism: Secondary | ICD-10-CM

## 2015-06-05 LAB — COMPREHENSIVE METABOLIC PANEL
ALBUMIN: 2.8 g/dL — AB (ref 3.5–5.0)
ALT: 51 U/L (ref 14–54)
AST: 48 U/L — AB (ref 15–41)
Alkaline Phosphatase: 94 U/L (ref 38–126)
Anion gap: 6 (ref 5–15)
BILIRUBIN TOTAL: 0.5 mg/dL (ref 0.3–1.2)
BUN: 10 mg/dL (ref 6–20)
CHLORIDE: 108 mmol/L (ref 101–111)
CO2: 23 mmol/L (ref 22–32)
Calcium: 8.1 mg/dL — ABNORMAL LOW (ref 8.9–10.3)
Creatinine, Ser: 0.88 mg/dL (ref 0.44–1.00)
GFR calc Af Amer: >60 mL/min (ref >60)
GFR calc non Af Amer: >60 mL/min (ref >60)
GLUCOSE: 103 mg/dL — AB (ref 65–99)
POTASSIUM: 3.8 mmol/L (ref 3.5–5.1)
SODIUM: 137 mmol/L (ref 135–145)
Total Protein: 6 g/dL — ABNORMAL LOW (ref 6.5–8.1)

## 2015-06-05 LAB — CBC
HEMATOCRIT: 34.4 % — AB (ref 36.0–46.0)
Hemoglobin: 11.5 g/dL — ABNORMAL LOW (ref 12.0–15.0)
MCH: 31.7 pg (ref 26.0–34.0)
MCHC: 33.4 g/dL (ref 30.0–36.0)
MCV: 94.8 fL (ref 78.0–100.0)
PLATELETS: 130 10*3/uL — AB (ref 150–400)
RBC: 3.63 MIL/uL — ABNORMAL LOW (ref 3.87–5.11)
RDW: 12.7 % (ref 11.5–15.5)
WBC: 5.1 10*3/uL (ref 4.0–10.5)

## 2015-06-05 MED ORDER — DIPHENHYDRAMINE HCL 50 MG/ML IJ SOLN: 25.0000 mg | Freq: Four times a day (QID) | INTRAMUSCULAR | Status: DC | PRN

## 2015-06-05 MED ORDER — HYDROCOD POLST-CPM POLST ER 10-8 MG/5ML PO SUER: 5.0000 mL | Freq: Two times a day (BID) | ORAL | Status: DC | PRN

## 2015-06-05 MED ORDER — DOXYCYCLINE HYCLATE 100 MG PO TABS
100.0000 mg | ORAL_TABLET | Freq: Two times a day (BID) | ORAL | Status: DC
  Administered 2015-06-05 – 2015-06-06 (×4): 100 mg via ORAL
  Filled 2015-06-05 (×4): qty 1

## 2015-06-05 MED ORDER — GUAIFENESIN ER 600 MG PO TB12
600.0000 mg | ORAL_TABLET | Freq: Two times a day (BID) | ORAL | Status: DC
  Administered 2015-06-05 – 2015-06-06 (×4): 600 mg via ORAL
  Filled 2015-06-05 (×4): qty 1

## 2015-06-05 MED ORDER — RISAQUAD PO CAPS
1.0000 | ORAL_CAPSULE | Freq: Every day | ORAL | Status: DC
  Administered 2015-06-05 – 2015-06-06 (×2): 1 via ORAL
  Filled 2015-06-05 (×4): qty 1

## 2015-06-05 NOTE — Progress Notes (Signed)
[place] TEAM 1 - Stepdown/ICU TEAM PROGRESS NOTE  Meghan Rodgers DGL:[id number] DOB: 07-10-1945 DOA: 06/03/2015 PCP: Delorse Lek, MD  Admit HPI / Brief Narrative: 70 y.o. female who presented to the ED due to what she believed to be an allergic reaction to ABx. Patient was on a course of Septra which she finished. Despite this she had been having fever at home for several days.  She went to her PCP and was found to have a nitrite positive UTI which was thought to be the source of her fever. She was started on Cipro. A few days after starting the Cipro a rash developed over her entire body.  She took benadryl and went to ED. Of notes she states that she was short of breath on exertion at home soon after the fevers started.   In the ED she was found to have a fever of 103.1.  HPI/Subjective: Continues to have a cough which is beginning to become more "rattling". Rash is improving. No new complaints.   Assessment/Plan:  FUO- likely acute bronchitis Tmax 103.1 at presentation - has not had a recurrence to that level - no localizing sx - flu negative - CXR w/o focal infiltrate - UA not c/w infection  - will add Doxycyline today (will start it in the hospital to ensure she is not allergic) which she can be discharged home on for acute bronchitis- can d/c Azactam on discharge.  Acute renal insufficiency GFR 35 at presentation w/ crt 1.54 - now signif improved w/ volume - follow   Rash  Does appear to be a drug erruption in response to Cipro - dose w/ 3 days of prednisone and follow - no blistering   UTI? UA at admit not c/w active UTI - no growth on culture   Code Status: FULL Family Communication:   Disposition Plan: expect d/c home tomorrow  Consultants: none  Procedures: none  Antibiotics: Aztreonam 1/15 > Vanc 1/15 > 1/16  DVT prophylaxis: lovenox   Objective: Blood pressure 108/77, pulse 72, temperature 98 F (36.7 C), temperature source Oral, resp. rate 18,  height  (1.676 m), weight 92.488 kg (203 lb 14.4 oz), SpO2 95 %.  Intake/Output Summary (Last 24 hours) at 06/05/15 1045 Last data filed at 06/05/15 0931  Gross per 24 hour  Intake 3013.33 ml  Output   4100 ml  Net -1086.67 ml   Exam: General: No acute respiratory distress - diffuse erthematous rash across back/chest - no blistering  Lungs: Clear to auscultation bilaterally without wheezes or crackles Cardiovascular: Regular rate and rhythm without murmur gallop or rub normal S1 and S2 Abdomen: Nontender, nondistended, soft, bowel sounds positive, no rebound, no ascites, no appreciable mass Extremities: No significant cyanosis, clubbing, or edema bilateral lower extremities  Data Reviewed:  Basic Metabolic Panel:  Recent Labs Lab 06/03/15 1323 06/04/15 0240 06/05/15 0657  NA 132* 138 137  K 3.8 4.9 3.8  CL 101 115* 108  CO2 20* 14* 23  GLUCOSE 125* 190* 103*  BUN 23* 13 10  CREATININE 1.54* 1.04* 0.88  CALCIUM 8.5* 7.8* 8.1*    CBC:  Recent Labs Lab 06/03/15 1323 06/04/15 0240 06/05/15 0657  WBC 3.4* 2.1* 5.1  NEUTROABS 2.9  --   --   HGB 14.1 12.1 11.5*  HCT 42.8 35.6* 34.4*  MCV 93.9 92.2 94.8  PLT 142* 97* 130*    Liver Function Tests:  Recent Labs Lab 06/03/15 1323 06/05/15 0657  AST 36 48*  ALT  30 51  ALKPHOS 135* 94  BILITOT 0.4 0.5  PROT 7.7 6.0*  ALBUMIN 3.9 2.8*    Recent Labs Lab 06/03/15 1323  LIPASE 34    Recent Results (from the past 240 hour(s))  Urine culture     Status: None   Collection Time: 06/03/15  1:24 PM  Result Value Ref Range Status   Specimen Description URINE, CLEAN CATCH  Final   Special Requests NONE  Final   Culture   Final    NO GROWTH 1 DAY Performed at [facility]    Report Status 06/04/2015 FINAL  Final  Culture, blood (Routine X 2) w Reflex to ID Panel     Status: None (Preliminary result)   Collection Time: 06/03/15  2:40 PM  Result Value Ref Range Status   Specimen Description BLOOD  LEFT ANTECUBITAL  Final   Special Requests BOTTLES DRAWN AEROBIC AND ANAEROBIC 5.5CC  Final   Culture   Final    NO GROWTH < 24 HOURS Performed at [facility]    Report Status PENDING  Incomplete  Culture, blood (Routine X 2) w Reflex to ID Panel     Status: None (Preliminary result)   Collection Time: 06/03/15  2:55 PM  Result Value Ref Range Status   Specimen Description BLOOD RIGHT HAND  Final   Special Requests BOTTLES DRAWN AEROBIC AND ANAEROBIC 5.5CC  Final   Culture   Final    NO GROWTH < 24 HOURS Performed at [facility]    Report Status PENDING  Incomplete  MRSA PCR Screening     Status: None   Collection Time: 06/03/15  9:00 PM  Result Value Ref Range Status   MRSA by PCR NEGATIVE NEGATIVE Final    Comment:        The GeneXpert MRSA Assay (FDA approved for NASAL specimens only), is one component of a comprehensive MRSA colonization surveillance program. It is not intended to diagnose MRSA infection nor to guide or monitor treatment for MRSA infections.      Studies:   Recent x-ray studies have been reviewed in detail by the Attending Physician  Scheduled Meds:  Scheduled Meds: . acidophilus  1 capsule Oral Daily  . aztreonam  2 g Intravenous Q8H  . doxycycline  100 mg Oral Q12H  . enoxaparin (LOVENOX) injection  40 mg Subcutaneous Q24H  . guaiFENesin  600 mg Oral BID    Time spent on care of this patient: 35 mins   Calvert Cantor , MD   [facility] Office  [phone] Pager - Text Page per Loretha Stapler as per below:  On-Call/Text Page:      [email or web address]      password [email or web address]  If 7PM-7AM, please contact night-coverage [email or web address] Password [email or web address] 06/05/2015, 10:45 AM   LOS: 2 days

## 2015-06-06 DIAGNOSIS — R21 Rash and other nonspecific skin eruption: Secondary | ICD-10-CM

## 2015-06-06 DIAGNOSIS — R509 Fever, unspecified: Secondary | ICD-10-CM

## 2015-06-06 DIAGNOSIS — N390 Urinary tract infection, site not specified: Secondary | ICD-10-CM

## 2015-06-06 LAB — CBC
HEMATOCRIT: 33 % — AB (ref 36.0–46.0)
HEMOGLOBIN: 11.1 g/dL — AB (ref 12.0–15.0)
MCH: 31.6 pg (ref 26.0–34.0)
MCHC: 33.6 g/dL (ref 30.0–36.0)
MCV: 94 fL (ref 78.0–100.0)
Platelets: 130 10*3/uL — ABNORMAL LOW (ref 150–400)
RBC: 3.51 MIL/uL — AB (ref 3.87–5.11)
RDW: 12.6 % (ref 11.5–15.5)
WBC: 4.3 10*3/uL (ref 4.0–10.5)

## 2015-06-06 LAB — BASIC METABOLIC PANEL
Anion gap: 7 (ref 5–15)
BUN: 8 mg/dL (ref 6–20)
CHLORIDE: 107 mmol/L (ref 101–111)
CO2: 26 mmol/L (ref 22–32)
Calcium: 8.2 mg/dL — ABNORMAL LOW (ref 8.9–10.3)
Creatinine, Ser: 0.73 mg/dL (ref 0.44–1.00)
GFR calc non Af Amer: >60 mL/min (ref >60)
Glucose, Bld: 102 mg/dL — ABNORMAL HIGH (ref 65–99)
Potassium: 3.3 mmol/L — ABNORMAL LOW (ref 3.5–5.1)
SODIUM: 140 mmol/L (ref 135–145)

## 2015-06-06 MED ORDER — BACID PO TABS: 2.0000 | ORAL_TABLET | Freq: Three times a day (TID) | ORAL | Status: DC

## 2015-06-06 MED ORDER — ALBUTEROL SULFATE (2.5 MG/3ML) 0.083% IN NEBU
2.5000 mg | INHALATION_SOLUTION | Freq: Four times a day (QID) | RESPIRATORY_TRACT | Status: DC | PRN
  Administered 2015-06-06: 2.5 mg via RESPIRATORY_TRACT
  Filled 2015-06-06: qty 3

## 2015-06-06 NOTE — Care Management Important Message (Signed)
Important Message  Patient Details  Name: Meghan Rodgers MRN: [id number] Date of Birth: 12/05/1945   Medicare Important Message Given:  Yes    Oralia Rud [name] 06/06/2015, 2:31 PM

## 2015-06-06 NOTE — Progress Notes (Signed)
[facility] PROGRESS NOTE  LESHONDA GALAMBOS ZOX:[id number] DOB: November 27, 1945 DOA: 06/03/2015 PCP: Delorse Lek, MD  Assessment/Plan: 70 y.o. female who presented to the ED due to what she believed to be an allergic reaction to ABx. Patient was on a course of Septra for bronchitis which she finished. Despite this she had been having fever at home for several days. She went to her PCP and was found to have a nitrite positive UTI which was thought to be the source of her fever. She was started on Cipro. A few days after starting the Cipro a rash developed over her entire body. She took benadryl and went to ED. Of notes she states that she was short of breath on exertion at home soon after the fevers started.  In the ED she was found to have a fever of 103.1.  1. Fever suspected due to acute bronchitis. Tmax 103.1 at presentation. has not had a recurrence to that level. flu negative.  CXR w/o focal infiltrate. UA not c/w infection. And urine, blood cultures: NGTD   -Pt is started Doxycyline (will start it in the hospital to ensure she is not allergic). Monitor for 24 hrs.  d/c Azactam on discharge. 2. Acute renal insufficiency GFR 35 at presentation w/ crt 1.54 - now signif improved w/ volume - follow  3. Rash Does appear to be a drug erruption in response to Cipro. Pt completed days of prednisone and follow - no blistering  4. UTI? UA at admit not c/w active UTI - no growth on culture   Code Status: full Family Communication: d/w patient (indicate person spoken with, relationship, and if by phone, the number) Disposition Plan: home in AM tomorrow    Consultants:  none  Procedures:  none  Antibiotics: Aztreonam 1/15 >  Vanc 1/15 > 1/16 (indicate start date, and stop date if known)  HPI/Subjective: Alert. Coughing   Objective: Filed Vitals:   06/05/15 2110 06/06/15 0435  BP: 143/71 127/61  Pulse: 71 74  Temp: 98.7 F (37.1 C) 98.5 F (36.9 C)  Resp: 16 16     Intake/Output Summary (Last 24 hours) at 06/06/15 0856 Last data filed at 06/06/15 8119  Gross per 24 hour  Intake   1060 ml  Output   4000 ml  Net  -2940 ml   Filed Weights   06/03/15 1213 06/03/15 1946  Weight: 88.451 kg (195 lb) 92.488 kg (203 lb 14.4 oz)    Exam:   General:  No distress   Cardiovascular: s1,s2 rrr  Respiratory: few wheezing   Abdomen: soft, nt,nd no  Musculoskeletal: no leg edema    Data Reviewed: Basic Metabolic Panel:  Recent Labs Lab 06/03/15 1323 06/04/15 0240 06/05/15 0657 06/06/15 0538  NA 132* 138 137 140  K 3.8 4.9 3.8 3.3*  CL 101 115* 108 107  CO2 20* 14* 23 26  GLUCOSE 125* 190* 103* 102*  BUN 23* CREATININE 1.54* 1.04* 0.88 0.73  CALCIUM 8.5* 7.8* 8.1* 8.2*   Liver Function Tests:  Recent Labs Lab 06/03/15 1323 06/05/15 0657  AST 36 48*  ALT 30 51  ALKPHOS 135* 94  BILITOT 0.4 0.5  PROT 7.7 6.0*  ALBUMIN 3.9 2.8*    Recent Labs Lab 06/03/15 1323  LIPASE 34   No results for input(s): AMMONIA in the last 168 hours. CBC:  Recent Labs Lab 06/03/15 1323 06/04/15 0240 06/05/15 0657 06/06/15 0538  WBC 3.4* 2.1* 5.1 4.3  NEUTROABS 2.9  --   --   --  HGB 14.1 12.1 11.5* 11.1*  HCT 42.8 35.6* 34.4* 33.0*  MCV 93.9 92.2 94.8 94.0  PLT 142* 97* 130* 130*   Cardiac Enzymes: No results for input(s): CKTOTAL, CKMB, CKMBINDEX, TROPONINI in the last 168 hours. BNP (last 3 results) No results for input(s): BNP in the last 8760 hours.  ProBNP (last 3 results) No results for input(s): PROBNP in the last 8760 hours.  CBG: No results for input(s): GLUCAP in the last 168 hours.  Recent Results (from the past 240 hour(s))  Urine culture     Status: None   Collection Time: 06/03/15  1:24 PM  Result Value Ref Range Status   Specimen Description URINE, CLEAN CATCH  Final   Special Requests NONE  Final   Culture   Final    NO GROWTH 1 DAY Performed at [facility]    Report Status 06/04/2015  FINAL  Final  Culture, blood (Routine X 2) w Reflex to ID Panel     Status: None (Preliminary result)   Collection Time: 06/03/15  2:40 PM  Result Value Ref Range Status   Specimen Description BLOOD LEFT ANTECUBITAL  Final   Special Requests BOTTLES DRAWN AEROBIC AND ANAEROBIC 5.5CC  Final   Culture   Final    NO GROWTH 2 DAYS Performed at [facility]    Report Status PENDING  Incomplete  Culture, blood (Routine X 2) w Reflex to ID Panel     Status: None (Preliminary result)   Collection Time: 06/03/15  2:55 PM  Result Value Ref Range Status   Specimen Description BLOOD RIGHT HAND  Final   Special Requests BOTTLES DRAWN AEROBIC AND ANAEROBIC 5.5CC  Final   Culture   Final    NO GROWTH 2 DAYS Performed at [facility]    Report Status PENDING  Incomplete  MRSA PCR Screening     Status: None   Collection Time: 06/03/15  9:00 PM  Result Value Ref Range Status   MRSA by PCR NEGATIVE NEGATIVE Final    Comment:        The GeneXpert MRSA Assay (FDA approved for NASAL specimens only), is one component of a comprehensive MRSA colonization surveillance program. It is not intended to diagnose MRSA infection nor to guide or monitor treatment for MRSA infections.      Studies: Dg Chest Port 1 View  06/05/2015  CLINICAL DATA:  Pneumonia. EXAM: PORTABLE CHEST 1 VIEW COMPARISON:  06/03/2015. FINDINGS: Mediastinum hilar structures are normal. Cardiomegaly with mild pulmonary vascular prominence and bilateral interstitial prominence suggesting mild congestive heart failure. No pleural effusion or pneumothorax. IMPRESSION: Congestive heart failure mild pulmonary interstitial edema. Electronically Signed   By: Maisie Fus  Register   On: 06/05/2015 07:52    Scheduled Meds: . acidophilus  1 capsule Oral Daily  . aztreonam  2 g Intravenous Q8H  . doxycycline  100 mg Oral Q12H  . enoxaparin (LOVENOX) injection  40 mg Subcutaneous Q24H  . guaiFENesin  600 mg Oral BID   Continuous  Infusions:   Principal Problem:   Fever Active Problems:   Rash and nonspecific skin eruption   UTI (urinary tract infection)    Time spent: >35 minutes     Esperanza Sheets  [facility] Pager [phone]. If 7PM-7AM, please contact night-coverage at [email or web address], password [facility] 06/06/2015, 8:56 AM  LOS: 3 days

## 2015-06-07 DIAGNOSIS — T7840XA Allergy, unspecified, initial encounter: Secondary | ICD-10-CM | POA: Insufficient documentation

## 2015-06-07 DIAGNOSIS — Z889 Allergy status to unspecified drugs, medicaments and biological substances status: Secondary | ICD-10-CM

## 2015-06-07 MED ORDER — ALBUTEROL SULFATE HFA 108 (90 BASE) MCG/ACT IN AERS: 2.0000 | INHALATION_SPRAY | Freq: Four times a day (QID) | RESPIRATORY_TRACT | Status: DC | PRN

## 2015-06-07 MED ORDER — DOXYCYCLINE HYCLATE 100 MG PO TABS: 100.0000 mg | ORAL_TABLET | Freq: Two times a day (BID) | ORAL | Status: DC

## 2015-06-07 NOTE — Progress Notes (Signed)
Discharge instructions and medications discussed with patient.  Prescriptions given to patient.  All questions answered.  

## 2015-06-07 NOTE — Discharge Summary (Signed)
Physician Discharge Summary  Meghan Rodgers UJW:[id number] DOB: Jun 15, 1945 DOA: 06/03/2015  PCP: Delorse Lek, MD  Admit date: 06/03/2015 Discharge date: 06/07/2015  Time spent: >35 minutes  Recommendations for Outpatient Follow-up:  F/u with PCP in 1 week as needed F/u with Pulmonology in 3-4 weeks  Discharge Diagnoses:  Principal Problem:   Fever Active Problems:   Rash and nonspecific skin eruption   UTI (urinary tract infection)   Discharge Condition: stable   Diet recommendation: regular   Filed Weights   06/03/15 1213 06/03/15 1946 06/06/15 2100  Weight: 88.451 kg (195 lb) 92.488 kg (203 lb 14.4 oz) 92.2 kg (203 lb 4.2 oz)    History of present illness:  70 y.o. female who presented to the ED due to what she believed to be an allergic reaction to ABx. Patient was on a course of Septra for bronchitis which she finished. Despite this she had been having fever at home for several days. She went to her PCP and was found to have a nitrite positive UTI which was thought to be the source of her fever. She was started on Cipro. A few days after starting the Cipro a rash developed over her entire body. She took benadryl and went to ED. Of notes she states that she was short of breath on exertion at home soon after the fevers started.  In the ED she was found to have a fever of 103.1.   Hospital Course:  1. Fever suspected due to acute bronchitis. Tmax 103.1 at presentation. flu negative. CXR w/o focal infiltrate. UA not c/w infection. And urine, blood cultures: NGTD  -Pt improved on IV aztreonam, oral doxycycline. Transitioned to oral regimen at discharge. D/w patient, recommended to f/u with outpatient PFTs in 3-4 weeks. (h/o tobacco use)  2. Acute renal insufficiency. Resolved with IVF 3. Rash Does appear to be a drug erruption in response to Cipro. Pt completed days of prednisone and follow - no blistering -rash is resolving   4. UTI? UA at admit not c/w active UTI  - no growth on culture    Procedures:  none (i.e. Studies not automatically included, echos, thoracentesis, etc; not x-rays)  Consultations:  none  Discharge Exam: Filed Vitals:   06/06/15 2100 06/07/15 0500  BP: 161/77 145/77  Pulse: 74 65  Temp: 98.4 F (36.9 C) 97.3 F (36.3 C)  Resp: 18 18    General: alert. No distress  Cardiovascular: s1,s2 rrr Respiratory: CTA BL  Discharge Instructions  Discharge Instructions    Diet - low sodium heart healthy    Complete by:  As directed      Discharge instructions    Complete by:  As directed   Please follow up with primary care doctor in 1 week     Increase activity slowly    Complete by:  As directed             Medication List    STOP taking these medications        NON FORMULARY      TAKE these medications        albuterol 108 (90 Base) MCG/ACT inhaler  Commonly known as:  PROVENTIL HFA;VENTOLIN HFA  Inhale 2 puffs into the lungs every 6 (six) hours as needed for wheezing or shortness of breath.     doxycycline 100 MG tablet  Commonly known as:  VIBRA-TABS  Take 1 tablet (100 mg total) by mouth every 12 (twelve) hours.     fluticasone 50  MCG/ACT nasal spray  Commonly known as:  FLONASE  Place 1 spray into both nostrils daily.       Allergies  Allergen Reactions  . Amoxicillin     Other reaction(s): Hives  . Ilosone [Erythromycin] Rash    Other reaction(s): Diarrhea, Hives  . Augmentin [Amoxicillin-Pot Clavulanate] Rash  . Ampicillin   . Ciprofloxacin Rash  . Naproxen Rash       Follow-up Information    Follow up with BURNETT,BRENT A, MD In 1 week.   Specialty:  Family Medicine   Contact information:   [address] [place] [address] [phone]        The results of significant diagnostics from this hospitalization (including imaging, microbiology, ancillary and laboratory) are listed below for reference.    Significant Diagnostic Studies: Dg Chest 2  View  06/03/2015  CLINICAL DATA:  Fever 3 days. EXAM: CHEST  2 VIEW COMPARISON:  None. FINDINGS: Mild cardiomegaly. Normal vascularity. Clear lungs. No pleural effusion or pneumothorax. Mild wedging of an upper thoracic vertebral body has a chronic appearance. IMPRESSION: Cardiomegaly without decompensation. Electronically Signed   By: Jolaine Click M.D.   On: 06/03/2015 13:30   Dg Chest Port 1 View  06/05/2015  CLINICAL DATA:  Pneumonia. EXAM: PORTABLE CHEST 1 VIEW COMPARISON:  06/03/2015. FINDINGS: Mediastinum hilar structures are normal. Cardiomegaly with mild pulmonary vascular prominence and bilateral interstitial prominence suggesting mild congestive heart failure. No pleural effusion or pneumothorax. IMPRESSION: Congestive heart failure mild pulmonary interstitial edema. Electronically Signed   By: Maisie Fus  Register   On: 06/05/2015 07:52    Microbiology: Recent Results (from the past 240 hour(s))  Urine culture     Status: None   Collection Time: 06/03/15  1:24 PM  Result Value Ref Range Status   Specimen Description URINE, CLEAN CATCH  Final   Special Requests NONE  Final   Culture   Final    NO GROWTH 1 DAY Performed at [facility]    Report Status 06/04/2015 FINAL  Final  Culture, blood (Routine X 2) w Reflex to ID Panel     Status: None (Preliminary result)   Collection Time: 06/03/15  2:40 PM  Result Value Ref Range Status   Specimen Description BLOOD LEFT ANTECUBITAL  Final   Special Requests BOTTLES DRAWN AEROBIC AND ANAEROBIC 5.5CC  Final   Culture   Final    NO GROWTH 3 DAYS Performed at [facility]    Report Status PENDING  Incomplete  Culture, blood (Routine X 2) w Reflex to ID Panel     Status: None (Preliminary result)   Collection Time: 06/03/15  2:55 PM  Result Value Ref Range Status   Specimen Description BLOOD RIGHT HAND  Final   Special Requests BOTTLES DRAWN AEROBIC AND ANAEROBIC 5.5CC  Final   Culture   Final    NO GROWTH 3 DAYS Performed  at [facility]    Report Status PENDING  Incomplete  MRSA PCR Screening     Status: None   Collection Time: 06/03/15  9:00 PM  Result Value Ref Range Status   MRSA by PCR NEGATIVE NEGATIVE Final    Comment:        The GeneXpert MRSA Assay (FDA approved for NASAL specimens only), is one component of a comprehensive MRSA colonization surveillance program. It is not intended to diagnose MRSA infection nor to guide or monitor treatment for MRSA infections.      Labs: Basic  Metabolic Panel:  Recent Labs Lab 06/03/15 1323 06/04/15 0240 06/05/15 0657 06/06/15 0538  NA 132* 138 137 140  K 3.8 4.9 3.8 3.3*  CL 101 115* 108 107  CO2 20* 14* 23 26  GLUCOSE 125* 190* 103* 102*  BUN 23* CREATININE 1.54* 1.04* 0.88 0.73  CALCIUM 8.5* 7.8* 8.1* 8.2*   Liver Function Tests:  Recent Labs Lab 06/03/15 1323 06/05/15 0657  AST 36 48*  ALT 30 51  ALKPHOS 135* 94  BILITOT 0.4 0.5  PROT 7.7 6.0*  ALBUMIN 3.9 2.8*    Recent Labs Lab 06/03/15 1323  LIPASE 34   No results for input(s): AMMONIA in the last 168 hours. CBC:  Recent Labs Lab 06/03/15 1323 06/04/15 0240 06/05/15 0657 06/06/15 0538  WBC 3.4* 2.1* 5.1 4.3  NEUTROABS 2.9  --   --   --   HGB 14.1 12.1 11.5* 11.1*  HCT 42.8 35.6* 34.4* 33.0*  MCV 93.9 92.2 94.8 94.0  PLT 142* 97* 130* 130*   Cardiac Enzymes: No results for input(s): CKTOTAL, CKMB, CKMBINDEX, TROPONINI in the last 168 hours. BNP: BNP (last 3 results) No results for input(s): BNP in the last 8760 hours.  ProBNP (last 3 results) No results for input(s): PROBNP in the last 8760 hours.  CBG: No results for input(s): GLUCAP in the last 168 hours.     SignedEsperanza Sheets  [facility] 06/07/2015, 8:44 AM

## 2015-06-08 LAB — CULTURE, BLOOD (ROUTINE X 2)
CULTURE: NO GROWTH
Culture: NO GROWTH

## 2015-06-14 ENCOUNTER — Encounter: Payer: Self-pay | Admitting: Adult Health

## 2015-07-05 ENCOUNTER — Ambulatory Visit (INDEPENDENT_AMBULATORY_CARE_PROVIDER_SITE_OTHER): Payer: Medicare Other | Admitting: Podiatry

## 2015-07-05 ENCOUNTER — Ambulatory Visit (INDEPENDENT_AMBULATORY_CARE_PROVIDER_SITE_OTHER): Payer: Medicare Other

## 2015-07-05 VITALS — Resp 16 | Ht 66.5 in | Wt 195.0 lb

## 2015-07-05 DIAGNOSIS — M79671 Pain in right foot: Secondary | ICD-10-CM

## 2015-07-05 DIAGNOSIS — L600 Ingrowing nail: Secondary | ICD-10-CM | POA: Diagnosis not present

## 2015-07-05 DIAGNOSIS — M722 Plantar fascial fibromatosis: Secondary | ICD-10-CM

## 2015-07-05 DIAGNOSIS — B351 Tinea unguium: Secondary | ICD-10-CM | POA: Diagnosis not present

## 2015-07-05 MED ORDER — TRIAMCINOLONE ACETONIDE 10 MG/ML IJ SUSP
10.0000 mg | Freq: Once | INTRAMUSCULAR | Status: AC
  Administered 2015-07-05: 10 mg

## 2015-07-05 NOTE — Progress Notes (Signed)
   Subjective:    Patient ID: Meghan Rodgers, female    DOB: 12/11/1945, 70 y.o.   MRN: [id number]  HPI Patient presents with bilateral nail problem. Right foot; great toe; nail discoloration & thickness; Left foot; great toe-lateral side; needs nails checked for nail fungus.  Patient also presents with foot pain in their Right foot; heel. Pt stated, "hurts more in the morning"'. Pt has hx of plantar fasciitis.   Review of Systems  All other systems reviewed and are negative.      Objective:   Physical Exam        Assessment & Plan:

## 2015-07-05 NOTE — Patient Instructions (Signed)

## 2015-07-05 NOTE — Progress Notes (Signed)
Subjective:     Patient ID: Meghan Rodgers, female   DOB: 10/09/1945, 70 y.o.   MRN: [id number]  HPI patient presents concerning of heel pain right stating it's been years but she's dealt with this before. Also is complaining about nails that she wanted to check because of discoloration   Review of Systems  All other systems reviewed and are negative.      Objective:   Physical Exam  Constitutional: She is oriented to person, place, and time.  Cardiovascular: Intact distal pulses.   Musculoskeletal: Normal range of motion.  Neurological: She is oriented to person, place, and time.  Skin: Skin is warm.  Nursing note and vitals reviewed.  neurovascular status found to be intact with muscle strength adequate range of motion within normal limits with patient found to have exquisite discomfort plantar aspect right heel insertional point tendon into the calcaneus and also noted to have localized discoloration of the hallux nailbeds bilateral. 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 For the heel and went ahead and injected the plantar fascia 3 mg Kenalog 5 mg Xylocaine and gave instructions on physical therapy. Dispensed fascial brace with instructions on usage and do not recommend treatment of the nailbeds given the length of time of having this and she will continue to use polish

## 2015-07-12 ENCOUNTER — Ambulatory Visit (INDEPENDENT_AMBULATORY_CARE_PROVIDER_SITE_OTHER): Payer: Medicare Other | Admitting: Podiatry

## 2015-07-12 ENCOUNTER — Encounter: Payer: Self-pay | Admitting: Podiatry

## 2015-07-12 VITALS — BP 150/76 | HR 68 | Resp 16

## 2015-07-12 DIAGNOSIS — M722 Plantar fascial fibromatosis: Secondary | ICD-10-CM | POA: Diagnosis not present

## 2015-07-12 MED ORDER — MELOXICAM 15 MG PO TABS: 15.0000 mg | ORAL_TABLET | Freq: Every day | ORAL | Status: DC

## 2015-07-12 NOTE — Patient Instructions (Signed)

## 2015-07-13 NOTE — Progress Notes (Signed)
Subjective:     Patient ID: Meghan Rodgers, female   DOB: [birth year], 70 y.o.   MRN: [id number]  HPI patient presents stating her heel is still quite sore but she feels like some improvement has occurred. Very tender when I pressed deep into the plantar fascia itself with moderate depression of the arch noted   Review of Systems     Objective:   Physical Exam Neurovascular status intact with continued discomfort plantar fascia upon deep palpation to the tendon with fluid buildup still noted in a special pain when getting up in the morning or after periods of sitting    Assessment:     Plantar fasciitis right with acute symptoms    Plan:     Reviewed condition and discussed importance of stretching exercises ice and shoe gear modifications. I dispensed a night splint with instructions on usage along with ice

## 2015-08-02 ENCOUNTER — Ambulatory Visit (INDEPENDENT_AMBULATORY_CARE_PROVIDER_SITE_OTHER): Payer: Medicare Other | Admitting: Podiatry

## 2015-08-02 DIAGNOSIS — M722 Plantar fascial fibromatosis: Secondary | ICD-10-CM | POA: Diagnosis not present

## 2015-08-02 MED ORDER — TRIAMCINOLONE ACETONIDE 10 MG/ML IJ SUSP
10.0000 mg | Freq: Once | INTRAMUSCULAR | Status: AC
Start: 2015-08-02 — End: 2015-08-02
  Administered 2015-08-02: 10 mg

## 2015-08-06 NOTE — Progress Notes (Signed)
Subjective:     Patient ID: Meghan Rodgers A Buckman, female   DOB: 03-Oct-1945, 70 y.o.   MRN: [id number]  HPI patient continues to experience discomfort in the plantar fascial with pain upon deep palpation right foot   Review of Systems     Objective:   Physical Exam Neurovascular status intact with continued discomfort plantar fascial right insertional point tendon into the calcaneus    Assessment:     Continued plantar fasciitis which is failed to respond so far to conservative treatments    Plan:     Chronic plantar fasciitis with failure so far to respond to conservative treatment. Discussed treatment options including long-term living with the pain versus continued conservative treatment versus shockwave or surgery and patient is going to think about shockwave and make appointment for this particular procedure

## 2015-09-11 ENCOUNTER — Telehealth: Payer: Self-pay | Admitting: Pulmonary Disease

## 2015-09-11 NOTE — Telephone Encounter (Signed)
Reviewed pt's request with Dr Vassie LollAlva and he suggests that pt contact her PCP for allergy testing or referral. Called and spoke with pt and explained. She will contact her PCP. Nothing further needed.

## 2015-12-17 ENCOUNTER — Encounter: Payer: Self-pay | Admitting: Pulmonary Disease

## 2015-12-17 ENCOUNTER — Ambulatory Visit (INDEPENDENT_AMBULATORY_CARE_PROVIDER_SITE_OTHER): Payer: Medicare Other | Admitting: Pulmonary Disease

## 2015-12-17 DIAGNOSIS — R21 Rash and other nonspecific skin eruption: Secondary | ICD-10-CM

## 2015-12-17 DIAGNOSIS — G4733 Obstructive sleep apnea (adult) (pediatric): Secondary | ICD-10-CM | POA: Diagnosis not present

## 2015-12-17 NOTE — Progress Notes (Signed)
   Subjective:    Patient ID: Meghan Rodgers, female    DOB: 1945/09/27, 70 y.o.   MRN: [id number]  HPI  70 year old female  for FU of obstructive sleep apnea.    12/17/2015 Chief Complaint  Patient presents with  . Sleep Apnea    doing well on cpap. no concerns.    She is doing well on her CPAP machine, no problems with mask and or pressure she has adjusted well to full face mask and denies dryness She's been getting her supplies and time and wonders if she needs that much CPAP download shows good compliance and good control of events and 10 cm with mild leak-but she does not seem to be bothered by this She is not feeling as refreshed as she was initially and wonders if this may be because of other stressors  She developed an allergic reaction to antibiotic-with a diffuse rash and required hospitalization for 4 days. She requests an allergy consultation for antibiotic panel testing.   Significant tests/ events  HST - Severe OSA - 61/hr 11/2014 CPAP 10 cm H2O with a X-Small size Resmed Full Face Mask AirFit F10       Review of Systems Patient denies significant dyspnea,cough, hemoptysis,  chest pain, palpitations, pedal edema, orthopnea, paroxysmal nocturnal dyspnea, lightheadedness, nausea, vomiting, abdominal or  leg pains      Objective:   Physical Exam  Gen. Pleasant, obese, in no distress ENT - no lesions, no post nasal drip Neck: No JVD, no thyromegaly, no carotid bruits Lungs: no use of accessory muscles, no dullness to percussion, decreased without rales or rhonchi  Cardiovascular: Rhythm regular, heart sounds  normal, no murmurs or gallops, no peripheral edema Musculoskeletal: No deformities, no cyanosis or clubbing , no tremors       Assessment & Plan:

## 2015-12-17 NOTE — Patient Instructions (Signed)
CPAP is set at 10 cm & working well

## 2015-12-17 NOTE — Assessment & Plan Note (Signed)
CPAP is set at 10 cm & working well  Weight loss encouraged, compliance with goal of at least 4-6 hrs every night is the expectation. Advised against medications with sedative side effects Cautioned against driving when sleepy - understanding that sleepiness will vary on a day to day basis

## 2015-12-17 NOTE — Addendum Note (Signed)
Addended by: York Ram on: 12/17/2015 12:23 PM   Modules accepted: Orders

## 2015-12-17 NOTE — Assessment & Plan Note (Signed)
We discussed allergies to multiple antibiotics. We discussed 10% cross-reactivity of cephalosporins and penicillins, other categories of antibiotics that can be used also include sulfa and doxycycline. We discussed allergy consultation for penicillin desensitization but she would like to defer this for now

## 2015-12-27 ENCOUNTER — Encounter: Payer: Self-pay | Admitting: Pulmonary Disease

## 2016-10-31 ENCOUNTER — Telehealth: Payer: Self-pay | Admitting: Pulmonary Disease

## 2016-10-31 NOTE — Telephone Encounter (Signed)
Certainly change the filter & if dyspnea is persistent, she will need OV

## 2016-10-31 NOTE — Telephone Encounter (Signed)
Spoke with the pt  She states having DOE for the past 2-3 days She has noticed SOB with grocery shopping and chores around the house  She has only been seen here for sleep  She checked her CPAP last night and realized that she has never changed the filter and it was very dirty  She is asking if this could cause her SOB  RA- please advise what you think  Note that we only have limited providers today with no openings in the scheduled for the next wk at least thanks

## 2016-10-31 NOTE — Telephone Encounter (Signed)
Spoke with the pt and notified of recs per RA  She verbalized understanding  She has filters and will change the old one out  Will call if not improving

## 2016-12-29 ENCOUNTER — Ambulatory Visit: Payer: Medicare Other | Admitting: Adult Health

## 2016-12-31 ENCOUNTER — Ambulatory Visit (INDEPENDENT_AMBULATORY_CARE_PROVIDER_SITE_OTHER): Payer: Medicare Other | Admitting: Adult Health

## 2016-12-31 ENCOUNTER — Encounter: Payer: Self-pay | Admitting: Adult Health

## 2016-12-31 DIAGNOSIS — G4733 Obstructive sleep apnea (adult) (pediatric): Secondary | ICD-10-CM

## 2016-12-31 NOTE — Assessment & Plan Note (Signed)
Wt loss  

## 2016-12-31 NOTE — Assessment & Plan Note (Signed)
Well controlled on CPAP  Plan  Patient Instructions  Continue on C Pap at bedtime Goal is to wear for at least 6 hours each night. Continue to work on weight loss. Do not drive if sleepy Follow-up with Dr. Vassie LollAlva in 1 yrear and as needed

## 2016-12-31 NOTE — Patient Instructions (Signed)
Continue on C Pap at bedtime Goal is to wear for at least 6 hours each night. Continue to work on weight loss. Do not drive if sleepy Follow-up with Dr. Vassie LollAlva in 1 yrear and as needed

## 2016-12-31 NOTE — Progress Notes (Signed)
@Patient  ID: Romilda Garret, female    DOB: 01-29-1946, 71 y.o.   MRN: [id number]  Chief Complaint  Patient presents with  . Follow-up    OSA     Referring provider: Delorse Lek, MD  HPI: 71 year old female followed for obstructive sleep apnea  TEST  Significant tests/ events  HST - Severe OSA - 61/hr 11/2014 CPAP 10 cm H2O with a X-Small size Resmed Full Face Mask AirFit F10   12/31/2016  Follow up : OSA  Patient presents for yearly follow-up for sleep apnea. She remains on C Pap at bedtime. Patient feels rested with no significant daytime sleepiness. Download shows excellent compliance with average usage around 6 hours. She is on a set pressure of 10 cm H2O. AHI 1.3. Minimal leaks. Discussed weight loss.  Says she forgot to change her filter and noticed it was dirty . She got new filter now .  Feels it caused her breathing not to be as good. Since new filter she is much better.  Denies cough , congestion , fever or discolored mucus .   Allergies  Allergen Reactions  . Amoxicillin     Other reaction(s): Hives  . Ilosone [Erythromycin] Rash    Other reaction(s): Diarrhea, Hives  . Augmentin [Amoxicillin-Pot Clavulanate] Rash  . Ampicillin   . Ciprofloxacin Rash  . Naproxen Rash    Immunization History  Administered Date(s) Administered  . Influenza,inj,Quad PF,36+ Mos 02/12/2015  . Influenza-Unspecified 01/18/2016  . Pneumococcal-Unspecified 01/18/2016    Past Medical History:  Diagnosis Date  . H/O bilateral salpingo-oophorectomy    Previous laparoscopic, for adenofibromas  . Hepatitis B    Positive for core antigen  . Nocturia   . Osteopenia    stable or improving  . PVC (premature ventricular contraction)    The Patient has atrial tachycardia in the past    Tobacco History: History  Smoking Status  . Former Smoker  . Packs/day: 1.00  . Years: 20.00  . Types: Cigarettes  . Quit date: 05/19/1993  Smokeless Tobacco  . Never Used    Comment:  smoked for 20 years off and on   Counseling given: Not Answered   Outpatient Encounter Prescriptions as of 12/31/2016  Medication Sig  . fluticasone (FLONASE) 50 MCG/ACT nasal spray Place 1 spray into both nostrils daily.  Marland Kitchen PRESCRIPTION MEDICATION Testosterone Cream   No facility-administered encounter medications on file as of 12/31/2016.      Review of Systems  Constitutional:   No  weight loss, night sweats,  Fevers, chills, fatigue, or  lassitude.  HEENT:   No headaches,  Difficulty swallowing,  Tooth/dental problems, or  Sore throat,                No sneezing, itching, ear ache, nasal congestion, post nasal drip,   CV:  No chest pain,  Orthopnea, PND, swelling in lower extremities, anasarca, dizziness, palpitations, syncope.   GI  No heartburn, indigestion, abdominal pain, nausea, vomiting, diarrhea, change in bowel habits, loss of appetite, bloody stools.   Resp: No shortness of breath with exertion or at rest.  No excess mucus, no productive cough,  No non-productive cough,  No coughing up of blood.  No change in color of mucus.  No wheezing.  No chest wall deformity  Skin: no rash or lesions.  GU: no dysuria, change in color of urine, no urgency or frequency.  No flank pain, no hematuria   MS:  No joint pain or swelling.  No decreased range of motion.  No back pain.    Physical Exam  BP 126/72 (BP Location: Left Arm, Patient Position: Sitting, Cuff Size: Normal)   Pulse 70   Ht 5' 6.75" (1.695 m)   Wt 202 lb 9.6 oz (91.9 kg)   SpO2 96%   BMI 31.97 kg/m   GEN: A/Ox3; pleasant , NAD, obese ,    HEENT:  [place]/AT,  EACs-clear, TMs-wnl, NOSE-clear, THROAT-clear, no lesions, no postnasal drip or exudate noted. Class 2- MP airway   NECK:  Supple w/ fair ROM; no JVD; normal carotid impulses w/o bruits; no thyromegaly or nodules palpated; no lymphadenopathy.    RESP  Clear  P & A; w/o, wheezes/ rales/ or rhonchi. no accessory muscle use, no dullness to  percussion  CARD:  RRR, no m/r/g, no peripheral edema, pulses intact, no cyanosis or clubbing.  GI:   Soft & nt; nml bowel sounds; no organomegaly or masses detected.   Musco: Warm bil, no deformities or joint swelling noted.   Neuro: alert, no focal deficits noted.    Skin: Warm, no lesions or rashes    Lab Results:   BNP No results found for: BNP  ProBNP No results found for: PROBNP  Imaging: No results found.   Assessment & Plan:   OSA (obstructive sleep apnea) Well controlled on CPAP  Plan  Patient Instructions  Continue on C Pap at bedtime Goal is to wear for at least 6 hours each night. Continue to work on weight loss. Do not drive if sleepy Follow-up with Dr. Vassie LollAlva in 1 yrear and as needed    Morbidly obese [facility]) Wt loss      Rubye Oaksammy Huma Imhoff, NP 12/31/2016

## 2017-05-13 ENCOUNTER — Emergency Department (HOSPITAL_BASED_OUTPATIENT_CLINIC_OR_DEPARTMENT_OTHER)
Admission: EM | Admit: 2017-05-13 | Discharge: 2017-05-13 | Disposition: A | Discharge status: Home / Self Care | Payer: Medicare Other | Attending: Emergency Medicine | Admitting: Emergency Medicine

## 2017-05-13 ENCOUNTER — Encounter (HOSPITAL_BASED_OUTPATIENT_CLINIC_OR_DEPARTMENT_OTHER): Payer: Self-pay

## 2017-05-13 ENCOUNTER — Other Ambulatory Visit: Payer: Self-pay

## 2017-05-13 DIAGNOSIS — T148XXA Other injury of unspecified body region, initial encounter: Secondary | ICD-10-CM

## 2017-05-13 DIAGNOSIS — Z79899 Other long term (current) drug therapy: Secondary | ICD-10-CM | POA: Insufficient documentation

## 2017-05-13 DIAGNOSIS — I10 Essential (primary) hypertension: Secondary | ICD-10-CM | POA: Insufficient documentation

## 2017-05-13 DIAGNOSIS — L089 Local infection of the skin and subcutaneous tissue, unspecified: Secondary | ICD-10-CM | POA: Diagnosis present

## 2017-05-13 DIAGNOSIS — Z87891 Personal history of nicotine dependence: Secondary | ICD-10-CM | POA: Insufficient documentation

## 2017-05-13 MED ORDER — DOXYCYCLINE HYCLATE 100 MG PO TABS
100.0000 mg | ORAL_TABLET | Freq: Once | ORAL | Status: AC
  Administered 2017-05-13: 100 mg via ORAL
  Filled 2017-05-13: qty 1

## 2017-05-13 MED ORDER — DOXYCYCLINE HYCLATE 100 MG PO CAPS: 100.0000 mg | ORAL_CAPSULE | Freq: Two times a day (BID) | ORAL | 0 refills | Status: DC

## 2017-05-13 NOTE — ED Notes (Signed)
ED Provider at bedside. 

## 2017-05-13 NOTE — ED Triage Notes (Signed)
Pt c/o infection to tip of left ring finger-cut to left thumb and a "lesion" to right thigh-NAD-steady gait

## 2017-05-18 NOTE — ED Provider Notes (Signed)
[facility] EMERGENCY DEPARTMENT Provider Note   CSN: [id number] Arrival date & time: 05/13/17  1215     History   Chief Complaint Chief Complaint  Patient presents with  . Multiple infected sites    HPI Meghan Rodgers is a 71 y.o. female.  HPI  71 year old female presenting for evaluation of several wound/skin lesions.  She reports 2 wounds around her fingernails which are actually resolving.  She states that pus was drained from both of them.  They are likely paronychia based on her exam.  She states that she does bite her fingernails frequently.  In the past day she has also noticed a small pustule to the medial aspect of her right thigh.  No fevers or chills.  Past Medical History:  Diagnosis Date  . H/O bilateral salpingo-oophorectomy    Previous laparoscopic, for adenofibromas  . Hepatitis B    Positive for core antigen  . Nocturia   . Osteopenia    stable or improving  . PVC (premature ventricular contraction)    The Patient has atrial tachycardia in the past    Patient Active Problem List   Diagnosis Date Noted  . Morbidly obese (HCC) 12/31/2016  . Allergic reaction caused by a drug   . Fever 06/03/2015  . Rash and nonspecific skin eruption 06/03/2015  . UTI (urinary tract infection) 06/03/2015  . OSA (obstructive sleep apnea) 10/09/2014  . Hepatitis B antibody positive 01/02/2012  . Premature ventricular beat 09/24/2011  . Hypertension 09/24/2011    Past Surgical History:  Procedure Laterality Date  . CHOLECYSTECTOMY  2000  . HERNIA REPAIR  1950, 1979  . OOPHORECTOMY  2004    OB History    No data available       Home Medications    Prior to Admission medications   Medication Sig Start Date End Date Taking? Authorizing Provider  doxycycline (VIBRAMYCIN) 100 MG capsule Take 1 capsule (100 mg total) by mouth 2 (two) times daily. 05/13/17   Raeford RazorKohut, Carolynne Schuchard, MD  fluticasone (FLONASE) 50 MCG/ACT nasal spray Place 1 spray into both  nostrils daily.    [provider]  PRESCRIPTION MEDICATION Testosterone Cream    [provider]    Family History Family History  Problem Relation Age of Onset  . Cancer Mother   . Heart disease Father     Social History Social History   Tobacco Use  . Smoking status: Former Smoker    Packs/day: 1.00    Years: 20.00    Pack years: 20.00    Types: Cigarettes    Last attempt to quit: 05/19/1993    Years since quitting: 24.0  . Smokeless tobacco: Never Used  . Tobacco comment: smoked for 20 years off and on  Substance Use Topics  . Alcohol use: Yes    Alcohol/week: 0.0 oz    Comment: 2 glasses a week  . Drug use: No     Allergies   Amoxicillin; Ilosone [erythromycin]; Augmentin [amoxicillin-pot clavulanate]; Ampicillin; Bactrim [sulfamethoxazole-trimethoprim]; Ciprofloxacin; and Naproxen   Review of Systems Review of Systems  All systems reviewed and negative, other than as noted in HPI. Physical Exam Updated Vital Signs There were no vitals taken for this visit.  Physical Exam  Constitutional: She appears well-developed and well-nourished. No distress.  HENT:  Head: Normocephalic and atraumatic.  Eyes: Conjunctivae are normal. Right eye exhibits no discharge. Left eye exhibits no discharge.  Neck: Neck supple.  Cardiovascular: Normal rate, regular rhythm and normal heart  sounds. Exam reveals no gallop and no friction rub.  No murmur heard. Pulmonary/Chest: Effort normal and breath sounds normal. No respiratory distress.  Abdominal: Soft. She exhibits no distension. There is no tenderness.  Musculoskeletal: She exhibits no edema or tenderness.  Resolving wounds around the nail folds of 2 fingers on both the right and left hand consistent with paronychia.  These appear to be healing appropriately.  She additionally has a small pustule to the medial aspect of her right thigh.  With perhaps some mild surrounding cellulitis.  Neurological: She is  alert.  Skin: Skin is warm and dry.  Psychiatric: She has a normal mood and affect. Her behavior is normal. Thought content normal.  Nursing note and vitals reviewed.    ED Treatments / Results  Labs (all labs ordered are listed, but only abnormal results are displayed) Labs Reviewed - No data to display  EKG  EKG Interpretation None       Radiology No results found.  Procedures Procedures (including critical care time)  Medications Ordered in ED Medications  doxycycline (VIBRA-TABS) tablet 100 mg (100 mg Oral Given 05/13/17 1448)     Initial Impression / Assessment and Plan / ED Course  I have reviewed the triage vital signs and the nursing notes.  Pertinent labs & imaging results that were available during my care of the patient were reviewed by me and considered in my medical decision making (see chart for details).     She has 2 resolving paronychia as well as a small pustule to the medial aspect of her right thigh.  Minimal surrounding cellulitis.  The pustule was unroofed.  She will be started on antibiotics.  Return precautions were discussed.  Final Clinical Impressions(s) / ED Diagnoses   Final diagnoses:  Wound infection  Pustule    ED Discharge Orders        Ordered    doxycycline (VIBRAMYCIN) 100 MG capsule  2 times daily     05/13/17 1550       Raeford RazorKohut, [place], MD 05/18/17 1350

## 2018-09-12 ENCOUNTER — Other Ambulatory Visit: Payer: Self-pay

## 2018-09-12 ENCOUNTER — Emergency Department (HOSPITAL_BASED_OUTPATIENT_CLINIC_OR_DEPARTMENT_OTHER): Payer: Medicare Other

## 2018-09-12 ENCOUNTER — Encounter (HOSPITAL_BASED_OUTPATIENT_CLINIC_OR_DEPARTMENT_OTHER): Payer: Self-pay | Admitting: *Deleted

## 2018-09-12 ENCOUNTER — Emergency Department (HOSPITAL_BASED_OUTPATIENT_CLINIC_OR_DEPARTMENT_OTHER)
Admission: EM | Admit: 2018-09-12 | Discharge: 2018-09-12 | Disposition: A | Discharge status: Home / Self Care | Payer: Medicare Other | Attending: Emergency Medicine | Admitting: Emergency Medicine

## 2018-09-12 DIAGNOSIS — R110 Nausea: Secondary | ICD-10-CM | POA: Diagnosis not present

## 2018-09-12 DIAGNOSIS — Z79899 Other long term (current) drug therapy: Secondary | ICD-10-CM | POA: Diagnosis not present

## 2018-09-12 DIAGNOSIS — R6889 Other general symptoms and signs: Secondary | ICD-10-CM

## 2018-09-12 DIAGNOSIS — Z87891 Personal history of nicotine dependence: Secondary | ICD-10-CM | POA: Diagnosis not present

## 2018-09-12 DIAGNOSIS — R197 Diarrhea, unspecified: Secondary | ICD-10-CM | POA: Insufficient documentation

## 2018-09-12 DIAGNOSIS — R509 Fever, unspecified: Secondary | ICD-10-CM | POA: Insufficient documentation

## 2018-09-12 DIAGNOSIS — R0602 Shortness of breath: Secondary | ICD-10-CM | POA: Diagnosis present

## 2018-09-12 DIAGNOSIS — Z20822 Contact with and (suspected) exposure to covid-19: Secondary | ICD-10-CM

## 2018-09-12 MED ORDER — ACETAMINOPHEN 325 MG PO TABS
650.0000 mg | ORAL_TABLET | Freq: Once | ORAL | Status: AC
  Administered 2018-09-12: 07:00:00 650 mg via ORAL
  Filled 2018-09-12: qty 2

## 2018-09-12 NOTE — ED Provider Notes (Addendum)
[facility] Provider Note: Meghan Rodgers Meghan Plouff, MD, FACEP  CSN: [id number] MRN: [id number] ARRIVAL: 09/12/18 at 0547 ROOM: MH11/[facility]11   CHIEF COMPLAINT  Shortness of Breath   HISTORY OF PRESENT ILLNESS  09/12/18 6:13 AM Meghan Rodgers is a 73 y.o. female with a 4-day history of mild gastrointestinal symptoms, specifically nausea and loose stools but no frank diarrhea or vomiting.  She has also had 2 days of mild shortness of breath.  This morning she woke up about 2 AM with alternating sensation of being hot and cold.  She took her temperature and found it to be 102.5.  It was 100.9 on arrival here.  She has not taken an antipyretic.  She has no known COVID-19 exposures.   Past Medical History:  Diagnosis Date  . H/O bilateral salpingo-oophorectomy    Previous laparoscopic, for adenofibromas  . Hepatitis B    Positive for core antigen  . Nocturia   . Osteopenia    stable or improving  . PVC (premature ventricular contraction)    The Patient has atrial tachycardia in the past    Past Surgical History:  Procedure Laterality Date  . CHOLECYSTECTOMY  2000  . HERNIA REPAIR  1950, 1979  . OOPHORECTOMY  2004    Family History  Problem Relation Age of Onset  . Cancer Mother   . Heart disease Father     Social History   Tobacco Use  . Smoking status: Former Smoker    Packs/day: 1.00    Years: 20.00    Pack years: 20.00    Types: Cigarettes    Last attempt to quit: 05/19/1993    Years since quitting: 25.3  . Smokeless tobacco: Never Used  . Tobacco comment: smoked for 20 years off and on  Substance Use Topics  . Alcohol use: Yes    Alcohol/week: 0.0 standard drinks    Comment: 2 glasses a week  . Drug use: No    Prior to Admission medications   Medication Sig Start Date End Date Taking? Authorizing Provider  doxycycline (VIBRAMYCIN) 100 MG capsule Take 1 capsule (100 mg total) by mouth 2 (two) times daily. 05/13/17   Raeford RazorKohut, Stephen, MD  fluticasone  (FLONASE) 50 MCG/ACT nasal spray Place 1 spray into both nostrils daily.    [provider]  PRESCRIPTION MEDICATION Testosterone Cream    [provider]    Allergies Amoxicillin; Ilosone [erythromycin]; Augmentin [amoxicillin-pot clavulanate]; Ampicillin; Bactrim [sulfamethoxazole-trimethoprim]; Ciprofloxacin; and Naproxen   REVIEW OF SYSTEMS  Negative except as noted here or in the History of Present Illness.   PHYSICAL EXAMINATION  Initial Vital Signs Blood pressure (!) 176/92, pulse 86, temperature (!) 100.9 F (38.3 C), temperature source Oral, resp. rate 20, height 5\' 6"  (1.676 m), weight 90.7 kg, SpO2 96 %.  Examination General: Well-developed, well-nourished female in no acute distress; appearance consistent with age of record HENT: normocephalic; atraumatic Eyes: pupils equal, round and reactive to light; extraocular muscles intact Neck: supple Heart: regular rate and rhythm Lungs: clear to auscultation bilaterally except mild coarse sounds in left base Abdomen: soft; nondistended; nontender; bowel sounds present Extremities: No deformity; full range of motion; pulses normal Neurologic: Awake, alert and oriented; motor function intact in all extremities and symmetric; no facial droop Skin: Warm and dry Psychiatric: Normal mood and affect   RESULTS  Summary of this visit's results, reviewed by myself:   EKG Interpretation  Date/Time:    Ventricular Rate:    PR Interval:  QRS Duration:   QT Interval:    QTC Calculation:   R Axis:     Text Interpretation:        Laboratory Studies: No results found for this or any previous visit (from the past 24 hour(s)). Imaging Studies: Dg Chest Port 1 View  Result Date: 09/12/2018 CLINICAL DATA:  Shortness of breath and fever EXAM: PORTABLE CHEST 1 VIEW COMPARISON:  06/05/2015 FINDINGS: The heart size and mediastinal contours are within normal limits. Both lungs are clear. The visualized skeletal  structures are unremarkable. IMPRESSION: No active disease. Electronically Signed   By: Deatra Robinson M.D.   On: 09/12/2018 06:47    ED COURSE and MDM  Nursing notes and initial vitals signs, including pulse oximetry, reviewed.  Vitals:   09/12/18 0601 09/12/18 0603  BP:  (!) 176/92  Pulse:  86  Resp:  20  Temp:  (!) 100.9 F (38.3 C)  TempSrc:  Oral  SpO2:  96%  Weight: 90.7 kg   Height: 5\' 6"  (1.676 m)    Meghan Rodgers was evaluated in Emergency Department on 09/12/2018 for the symptoms described in the history of present illness. She was evaluated in the context of the global COVID-19 pandemic, which necessitated consideration that the patient might be at risk for infection with the SARS-CoV-2 virus that causes COVID-19. Institutional protocols and algorithms that pertain to the evaluation of patients at risk for COVID-19 are in a state of rapid change based on information released by regulatory bodies including the CDC and federal and state organizations. These policies and algorithms were followed during the patient's care in the ED.  7:01 AM Patient advised of reassuring chest x-ray.  Her symptoms are suggestive of COVID-19 but not diagnostic.  She does not meet [place] criteria for testing at this time.  If symptoms worsen and she is having difficulty breathing she should present to [facility] or [facility] long hospital.  PROCEDURES    ED DIAGNOSES     ICD-10-CM   1. Suspected Covid-19 Virus Infection R68.89        Paula Libra, MD 09/12/18 4917    Paula Libra, MD 09/12/18 [phone]

## 2018-09-12 NOTE — ED Triage Notes (Addendum)
C/o temp that started this morning (102) C/o feeling a little sob. C/o feeling nauseated. C/o loose stools for past 5 days. C/o dry cough. Has not taken any medications prior to arrival.  resp even and unlabored. No known covid exposures

## 2018-09-12 NOTE — ED Notes (Signed)
Portable chest xray in progress.

## 2021-01-24 ENCOUNTER — Other Ambulatory Visit: Payer: Self-pay | Admitting: Family Medicine

## 2021-01-24 DIAGNOSIS — R55 Syncope and collapse: Secondary | ICD-10-CM

## 2021-02-05 ENCOUNTER — Ambulatory Visit
Admission: RE | Admit: 2021-02-05 | Discharge: 2021-02-05 | Disposition: A | Discharge status: Home / Self Care | Payer: Medicare Other | Source: Ambulatory Visit | Attending: Family Medicine | Admitting: Family Medicine

## 2021-02-05 DIAGNOSIS — R55 Syncope and collapse: Secondary | ICD-10-CM

## 2021-12-19 IMAGING — CT CT HEAD W/O CM
4 series · 16 of 47 positions shown, 18 images · non-contrast
Comparison: None.

CLINICAL DATA: 74-year-old female with syncope.

EXAM:
CT HEAD WITHOUT CONTRAST
TECHNIQUE: Contiguous axial images were obtained from the base of the skull
through the vertex without intravenous contrast.

[Series 2: head 5.00 hr40 s3 axial ibhc · axial · 0.41mm/px · z∈[-618,-498]mm · 7 of 33 slices shown, 9 images]
[im 5/33  brain]
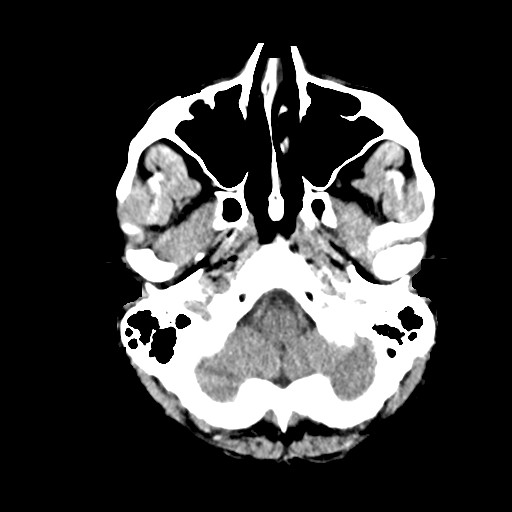
[im 5/33  bone]
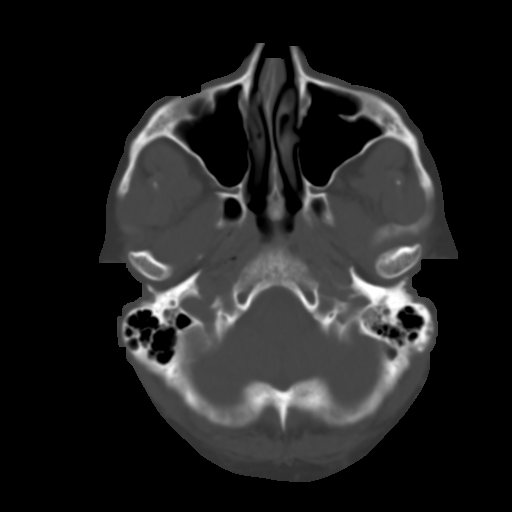
[im 9/33  brain]
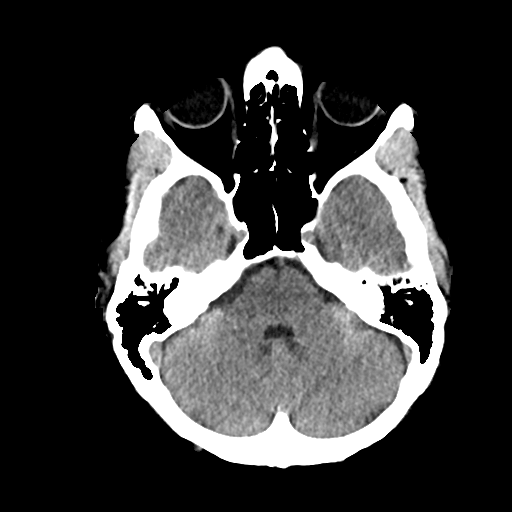
[im 13/33  brain]
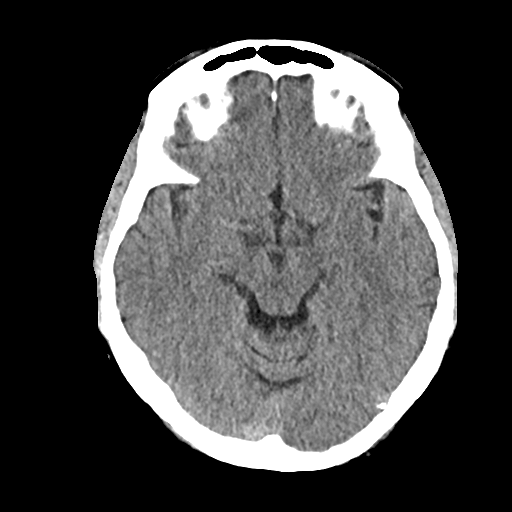
[im 17/33  brain]
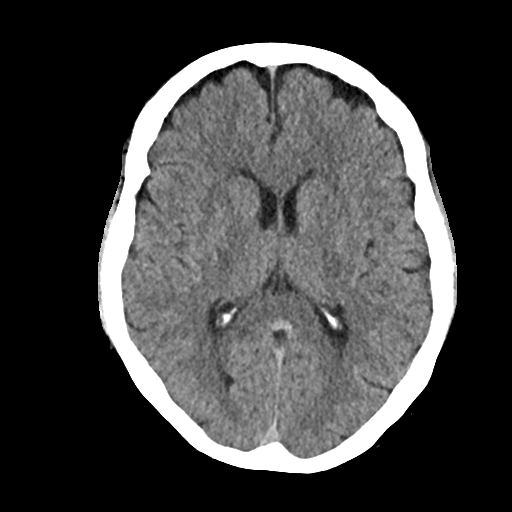
[im 21/33  brain]
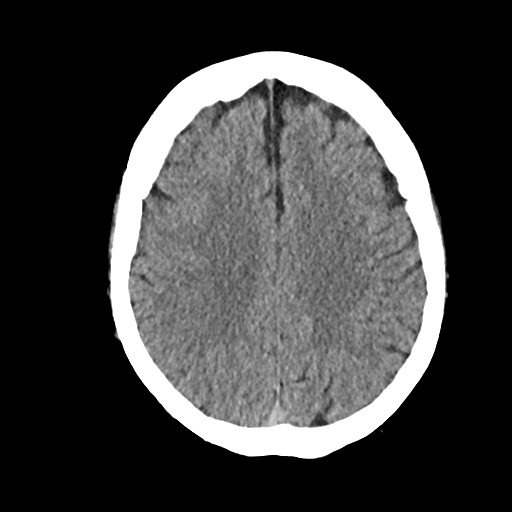
[im 21/33  bone]
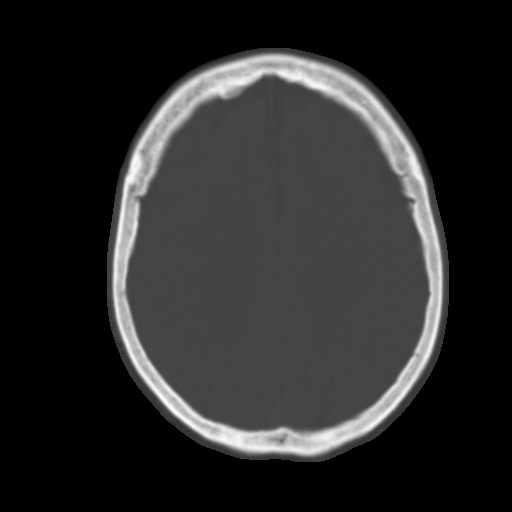
[im 25/33  brain]
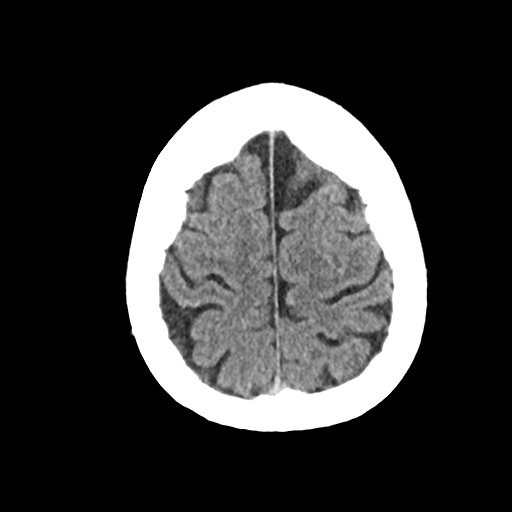
[im 29/33  brain]
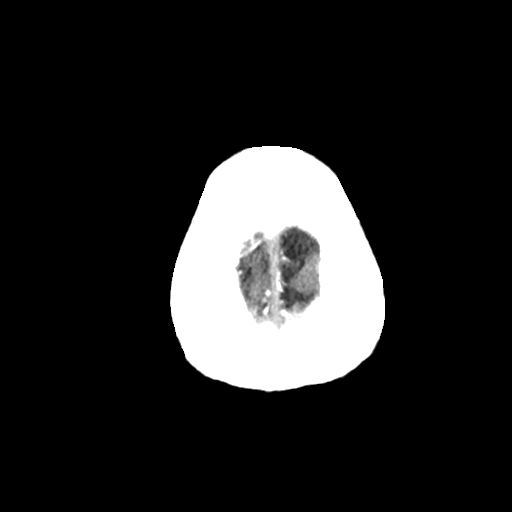

[Series 3: head 2.00 hr60 s3 axial bone · axial · 0.41mm/px · z∈[-623,-591]mm · 3 of 83 slices shown]
[im 9/83  bone]
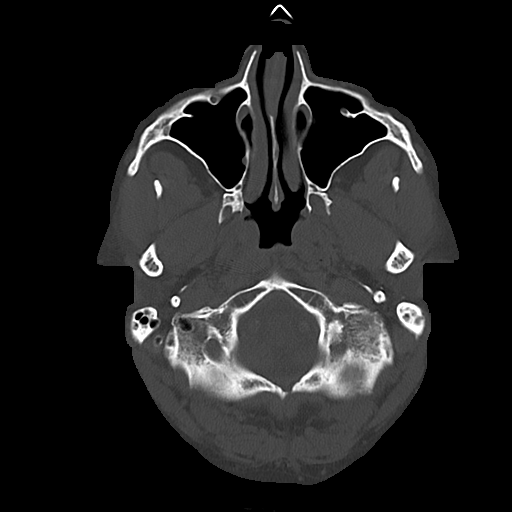
[im 17/83  bone]
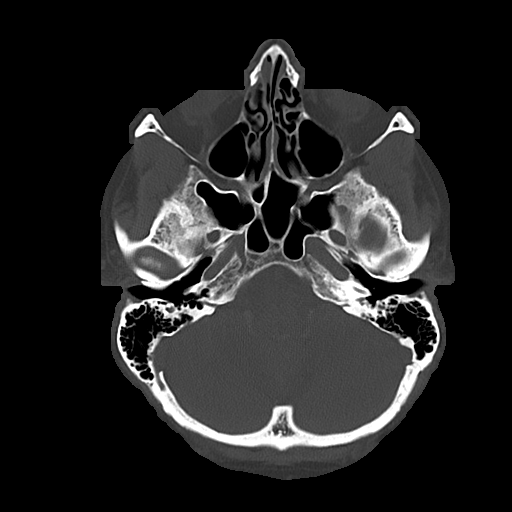
[im 25/83  bone]
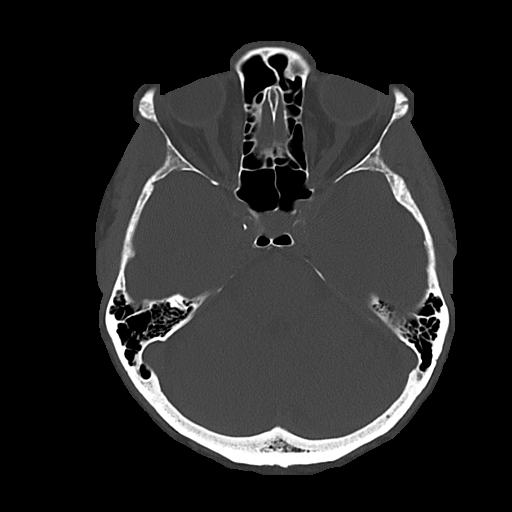

[Series 4: head 3.00 hr40 s3 sag · sagittal · 0.32mm/px · 3 of 62 slices shown]
[im 21/62  brain]
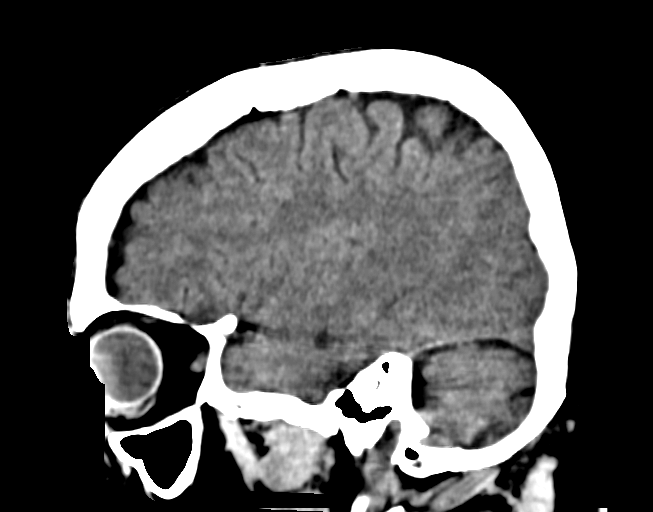
[im 31/62  brain]
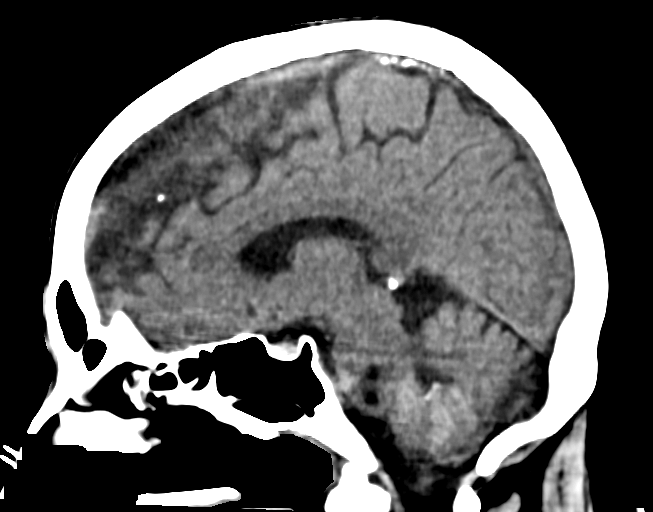
[im 41/62  brain]
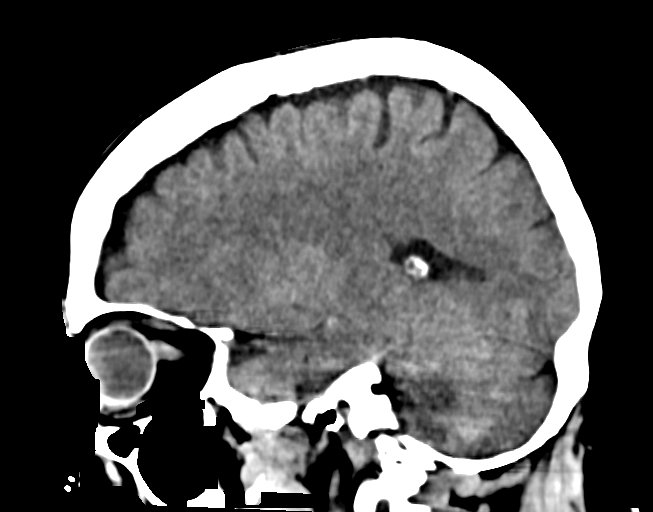

[Series 6: head 3.00 hr40 s3 cor · coronal · 0.32mm/px · 3 of 68 slices shown]
[im 23/68  brain]
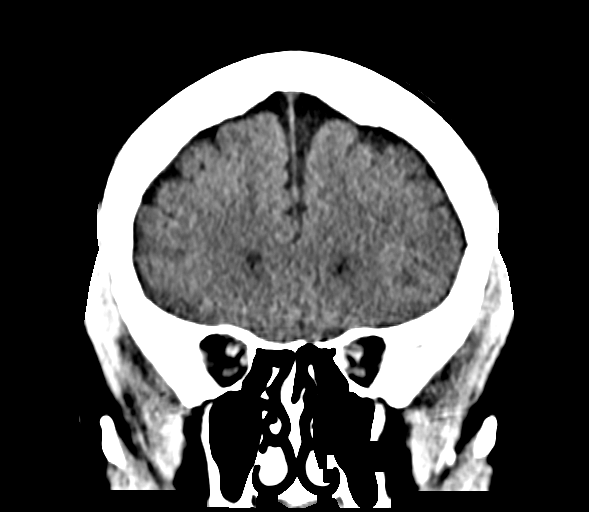
[im 30/68  brain]
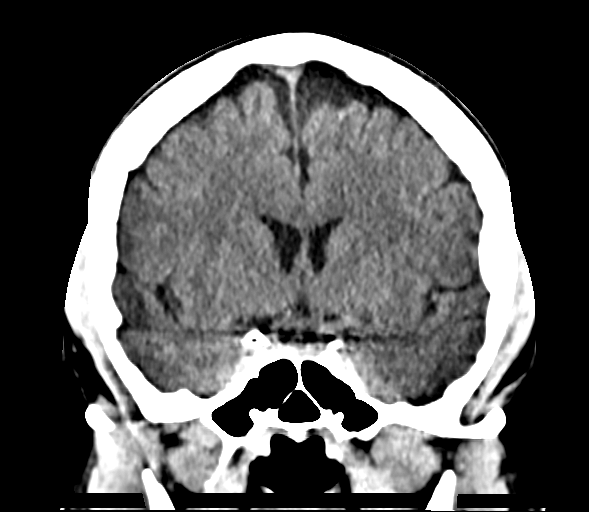
[im 38/68  brain]
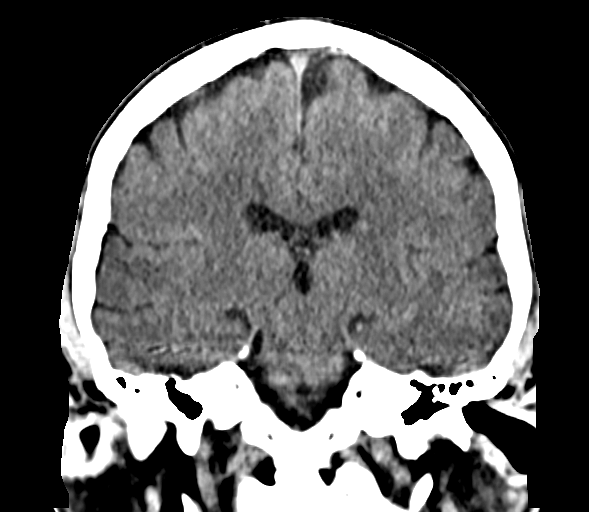

[16 of 47 positions shown; findings below may reference images not displayed]

FINDINGS: Brain: Cerebral volume is within normal limits for age. No midline
shift, ventriculomegaly, mass effect, evidence of mass lesion,
intracranial hemorrhage or evidence of cortically based acute
infarction. Gray-white matter differentiation is within normal
limits for age. Subtle vascular calcifications at the bilateral
basal ganglia.

Vascular: Calcified atherosclerosis at the skull base. No suspicious
intracranial vascular hyperdensity.

Skull: Negative.

Sinuses/Orbits: Mild mucosal thickening and alveolar recess
retention cyst in the right maxillary sinus. Other Visualized
paranasal sinuses and mastoids are clear. Tympanic cavities are
clear.

Other: Postoperative changes to both globes. No acute orbit or scalp
soft tissue finding.
IMPRESSION: Normal for age non contrast CT appearance of the brain.

## 2022-09-17 ENCOUNTER — Other Ambulatory Visit: Payer: Self-pay | Admitting: Obstetrics and Gynecology

## 2022-09-17 DIAGNOSIS — Z8249 Family history of ischemic heart disease and other diseases of the circulatory system: Secondary | ICD-10-CM

## 2022-09-18 ENCOUNTER — Other Ambulatory Visit: Payer: Self-pay | Admitting: Obstetrics and Gynecology

## 2022-09-18 DIAGNOSIS — Z800 Family history of malignant neoplasm of digestive organs: Secondary | ICD-10-CM

## 2023-06-30 ENCOUNTER — Ambulatory Visit (HOSPITAL_COMMUNITY)
Admission: EM | Admit: 2023-06-30 | Discharge: 2023-06-30 | Disposition: A | Discharge status: Home / Self Care | Payer: Medicare Other

## 2023-06-30 DIAGNOSIS — F321 Major depressive disorder, single episode, moderate: Secondary | ICD-10-CM

## 2023-06-30 NOTE — Progress Notes (Signed)
   06/30/23 0739  [facility] Triage Screening (Walk-ins at [facility] only)  How Did You Hear About [place]? Family/Friend  What Is the Reason for Your Visit/Call Today? Meghan Rodgers presents to [facility] voluntarily accompanied by her husband. Pt states that she has worsing depression. Pt states that she has an appt [facility] in March. Pt states that her last SI thought was around Christmas and she didn't have a plan. Pt currently denies SI, HI, AVH and alcohol/drug use. Pt is hoping to get started on an anti-depressant.  How Long Has This Been Causing You Problems? 1-6 months  Have You Recently Had Any Thoughts About Hurting Yourself? Yes  How long ago did you have thoughts about hurting yourself? around Christmas - no plan, just a thought  Are You Planning to Commit Suicide/Harm Yourself At This time? No  Have you Recently Had Thoughts About Hurting Someone Meghan Rodgers? No  Are You Planning To Harm Someone At This Time? No  Physical Abuse Denies  Verbal Abuse Denies  Sexual Abuse Denies  Exploitation of patient/patient's resources Yes, past (Comment);Yes, present (Comment)  Self-Neglect Denies  Are you currently experiencing any auditory, visual or other hallucinations? No  Have You Used Any Alcohol or Drugs in the Past 24 Hours? No  Do you have any current medical co-morbidities that require immediate attention? No  Clinician description of patient physical appearance/behavior: well groomed, calm, cooperative  What Do You Feel Would Help You the Most Today? Social Support;Treatment for Depression or other mood problem;Medication(s)  If access to [facility] was not available, would you have sought care in the Emergency Department? No  Determination of Need Routine (7 days)  Options For Referral Medication Management

## 2023-06-30 NOTE — ED Provider Notes (Signed)
Behavioral Health Urgent Care Medical Screening Exam  Patient Name: Meghan Rodgers MRN: [id number] Date of Evaluation: 06/30/23 Chief Complaint:   Diagnosis:  Final diagnoses:  Current moderate episode of major depressive disorder, unspecified whether recurrent (HCC)    History of Present illness: Meghan Rodgers is a 78 y.o. female.  Patient presents to [facility] voluntarily, accompanied by her family and seeking help for her current emotional instabilities. Patient reports that since COVID19, she got depressed and lost interest in things that she used to enjoy such as taking care of the house, sewing, gardening.... Symptoms increased 3 years ago when spouse had a facial surgery and she had to be there for him. While taking care of her husband, patient found out that spouse was having distance relationships with other women and was spending money on them. In addition to that, spouse had a preexisting gambling problem that was causing financial difficulties in the family. Patient became more and more hopeless as her husband kept spending the money to the risk of causing homelessness. A few months ago, around Christmas time, patient started feeling overwhelmed and informed her children. Her daughter advised her to seek help but also confronted her dad encouraging him to seek help for his addiction. Patient was scheduled for therapy at [facility] in March. She denies SI/HI and reports that she is feeling better now that her husband is willing to work on his problems and children are helping and supporting.   Upon face-to-face assessment, patient appears sad and depressed but reports that "I am actually  feeling better after I talk to you all, and after hearing that he is willing to change". She is alert and oriented x 4. Her thought process is clear, organized and goal-directed. Patient is pleasant and cooperative and motivated for therapy. She reports that her depressive symptoms started during Covid19  when she lost interest in activities that she used to enjoy. She reports that her husband made it worse when she noticed that he was seeing other women and was giving them the family money "I felt so betrayed".  Patient reports that they have lost properties due to spouse' problem of gambling. Patient reports that she is now connected with Colvin Caroli at [facility]  and her children are helping in managing her husband.  She reports no additional concerns. She denies medical issues. Denies pain. Denies headache/dizziness. Denies respiratory distress. Denies chest pain. Denies nausea/vomiting.  Patient reports that she has good support from her children and their spouses.   Patient reports no safety concerns at home. She is motivated for outpatient services at [facility]. Encouragements and support provided. Dr Enedina Finner was consulted and agreed with this medical decision.   Flowsheet Row ED from 06/30/2023 in [facility]  C-SSRS RISK CATEGORY No Risk       Psychiatric Specialty Exam  Presentation  General Appearance:Appropriate for Environment  Eye Contact:Good  Speech:Clear and Coherent  Speech Volume:Normal  Handedness:Right   Mood and Affect  Mood: Depressed  Affect: Appropriate   Thought Process  Thought Processes: Coherent  Descriptions of Associations:Intact  Orientation:Full (Time, Place and Person)  Thought Content:Logical    Hallucinations:None  Ideas of Reference:None  Suicidal Thoughts:No  Homicidal Thoughts:No   Sensorium  Memory: Immediate Good; Recent Good; Remote Good  Judgment: Fair  Insight: Fair   Chartered certified accountant: Fair  Attention Span: Fair  Recall: [employer] of Knowledge: Fair  Language: Fair   Psychomotor Activity  Psychomotor Activity:No data recorded  Assets  Assets: Communication Skills; Desire for Improvement; Housing; Social Support   Sleep   Sleep: Fair  Number of hours:  5   Physical Exam: Physical Exam Vitals and nursing note reviewed.  Constitutional:      Appearance: Normal appearance.  HENT:     Head: Normocephalic and atraumatic.     Right Ear: Tympanic membrane normal.     Left Ear: Tympanic membrane normal.     Nose: Nose normal.  Eyes:     Extraocular Movements: Extraocular movements intact.     Pupils: Pupils are equal, round, and reactive to light.  Cardiovascular:     Rate and Rhythm: Normal rate.     Pulses: Normal pulses.  Pulmonary:     Effort: Pulmonary effort is normal.  Musculoskeletal:        General: Normal range of motion.     Cervical back: Normal range of motion and neck supple.  Neurological:     General: No focal deficit present.     Mental Status: She is alert and oriented to person, place, and time.  Psychiatric:        Thought Content: Thought content normal.    Review of Systems  Constitutional: Negative.   HENT: Negative.    Eyes: Negative.   Respiratory: Negative.    Cardiovascular: Negative.   Gastrointestinal: Negative.   Genitourinary: Negative.   Musculoskeletal: Negative.   Skin: Negative.   Neurological: Negative.   Endo/Heme/Allergies: Negative.   Psychiatric/Behavioral:  Positive for depression.    Blood pressure (!) 134/91, pulse 93, temperature 98.3 F (36.8 C), temperature source Oral, resp. rate 17, SpO2 97%. There is no height or weight on file to calculate BMI.  Musculoskeletal: Strength & Muscle Tone: within normal limits Gait & Station: normal Patient leans: N/A   [facility] MSE Discharge Disposition for Follow up and Recommendations: Based on my evaluation the patient does not appear to have an emergency medical condition and can be discharged with resources and follow up care in outpatient services for Medication Management, Individual Therapy, and Group Therapy   Olin Pia, NP 06/30/2023, 2:02 PM

## 2023-06-30 NOTE — Discharge Instructions (Signed)

## 2023-10-07 ENCOUNTER — Other Ambulatory Visit (HOSPITAL_COMMUNITY): Payer: Self-pay | Admitting: Obstetrics and Gynecology

## 2023-10-07 DIAGNOSIS — Z8249 Family history of ischemic heart disease and other diseases of the circulatory system: Secondary | ICD-10-CM
